# Patient Record
Sex: Female | Born: 2000 | Race: White | Hispanic: No | Marital: Single | Smoking: Current every day smoker
Health system: Southern US, Community
[De-identification: ages and names within clinical notes are randomized; demographics above are authoritative.]

## PROBLEM LIST (undated history)

## (undated) DIAGNOSIS — R011 Cardiac murmur, unspecified: Secondary | ICD-10-CM

## (undated) DIAGNOSIS — F329 Major depressive disorder, single episode, unspecified: Secondary | ICD-10-CM

## (undated) DIAGNOSIS — E46 Unspecified protein-calorie malnutrition: Secondary | ICD-10-CM

## (undated) DIAGNOSIS — F419 Anxiety disorder, unspecified: Secondary | ICD-10-CM

## (undated) DIAGNOSIS — T7840XA Allergy, unspecified, initial encounter: Secondary | ICD-10-CM

## (undated) DIAGNOSIS — F32A Depression, unspecified: Secondary | ICD-10-CM

## (undated) DIAGNOSIS — B1920 Unspecified viral hepatitis C without hepatic coma: Secondary | ICD-10-CM

## (undated) DIAGNOSIS — R569 Unspecified convulsions: Secondary | ICD-10-CM

## (undated) HISTORY — DX: Allergy, unspecified, initial encounter: T78.40XA

## (undated) HISTORY — DX: Unspecified viral hepatitis C without hepatic coma: B19.20

---
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
## 2019-01-29 ENCOUNTER — Ambulatory Visit: Payer: Commercial Managed Care - PPO | Admitting: Family Medicine

## 2019-01-29 ENCOUNTER — Encounter: Payer: Self-pay | Admitting: Family Medicine

## 2019-01-29 VITALS — BP 98/68 | HR 98 | Temp 98.5°F | Wt 115.0 lb

## 2019-01-29 DIAGNOSIS — F329 Major depressive disorder, single episode, unspecified: Secondary | ICD-10-CM

## 2019-01-29 DIAGNOSIS — F1910 Other psychoactive substance abuse, uncomplicated: Secondary | ICD-10-CM

## 2019-01-29 DIAGNOSIS — F419 Anxiety disorder, unspecified: Secondary | ICD-10-CM | POA: Diagnosis not present

## 2019-01-29 NOTE — Patient Instructions (Signed)
Living With Anxiety  After being diagnosed with an anxiety disorder, you may be relieved to know why you have felt or behaved a certain way. It is natural to also feel overwhelmed about the treatment ahead and what it will mean for your life. With care and support, you can manage this condition and recover from it. How to cope with anxiety Dealing with stress Stress is your body's reaction to life changes and events, both good and bad. Stress can last just a few hours or it can be ongoing. Stress can play a major role in anxiety, so it is important to learn both how to cope with stress and how to think about it differently. Talk with your health care provider or a counselor to learn more about stress reduction. He or she may suggest some stress reduction techniques, such as:  Music therapy. This can include creating or listening to music that you enjoy and that inspires you.  Mindfulness-based meditation. This involves being aware of your normal breaths, rather than trying to control your breathing. It can be done while sitting or walking.  Centering prayer. This is a kind of meditation that involves focusing on a word, phrase, or sacred image that is meaningful to you and that brings you peace.  Deep breathing. To do this, expand your stomach and inhale slowly through your nose. Hold your breath for 3-5 seconds. Then exhale slowly, allowing your stomach muscles to relax.  Self-talk. This is a skill where you identify thought patterns that lead to anxiety reactions and correct those thoughts.  Muscle relaxation. This involves tensing muscles then relaxing them. Choose a stress reduction technique that fits your lifestyle and personality. Stress reduction techniques take time and practice. Set aside 5-15 minutes a day to do them. Therapists can offer training in these techniques. The training may be covered by some insurance plans. Other things you can do to manage stress include:  Keeping a  stress diary. This can help you learn what triggers your stress and ways to control your response.  Thinking about how you respond to certain situations. You may not be able to control everything, but you can control your reaction.  Making time for activities that help you relax, and not feeling guilty about spending your time in this way. Therapy combined with coping and stress-reduction skills provides the best chance for successful treatment. Medicines Medicines can help ease symptoms. Medicines for anxiety include:  Anti-anxiety drugs.  Antidepressants.  Beta-blockers. Medicines may be used as the main treatment for anxiety disorder, along with therapy, or if other treatments are not working. Medicines should be prescribed by a health care provider. Relationships Relationships can play a big part in helping you recover. Try to spend more time connecting with trusted friends and family members. Consider going to couples counseling, taking family education classes, or going to family therapy. Therapy can help you and others better understand the condition. How to recognize changes in your condition Everyone has a different response to treatment for anxiety. Recovery from anxiety happens when symptoms decrease and stop interfering with your daily activities at home or work. This may mean that you will start to:  Have better concentration and focus.  Sleep better.  Be less irritable.  Have more energy.  Have improved memory. It is important to recognize when your condition is getting worse. Contact your health care provider if your symptoms interfere with home or work and you do not feel like your condition is improving. Where   to find help and support: You can get help and support from these sources:  Self-help groups.  Online and [employer].  A trusted spiritual leader.  Couples counseling.  Family education classes.  Family therapy. Follow these instructions  at home:  Eat a healthy diet that includes plenty of vegetables, fruits, whole grains, low-fat dairy products, and lean protein. Do not eat a lot of foods that are high in solid fats, added sugars, or salt.  Exercise. Most adults should do the following: ? Exercise for at least 150 minutes each week. The exercise should increase your heart rate and make you sweat (moderate-intensity exercise). ? Strengthening exercises at least twice a week.  Cut down on caffeine, tobacco, alcohol, and other potentially harmful substances.  Get the right amount and quality of sleep. Most adults need 7-9 hours of sleep each night.  Make choices that simplify your life.  Take over-the-counter and prescription medicines only as told by your health care provider.  Avoid caffeine, alcohol, and certain over-the-counter cold medicines. These may make you feel worse. Ask your pharmacist which medicines to avoid.  Keep all follow-up visits as told by your health care provider. This is important. Questions to ask your health care provider  Would I benefit from therapy?  How often should I follow up with a health care provider?  How long do I need to take medicine?  Are there any long-term side effects of my medicine?  Are there any alternatives to taking medicine? Contact a health care provider if:  You have a hard time staying focused or finishing daily tasks.  You spend many hours a day feeling worried about everyday life.  You become exhausted by worry.  You start to have headaches, feel tense, or have nausea.  You urinate more than normal.  You have diarrhea. Get help right away if:  You have a racing heart and shortness of breath.  You have thoughts of hurting yourself or others. If you ever feel like you may hurt yourself or others, or have thoughts about taking your own life, get help right away. You can go to your nearest emergency department or call:  Your local emergency services  (911 in the [place]  A suicide crisis helpline, such as the National Suicide Prevention Lifeline at 854-763-5670. This is open 24-hours a day. Summary  Taking steps to deal with stress can help calm you.  Medicines cannot cure anxiety disorders, but they can help ease symptoms.  Family, friends, and partners can play a big part in helping you recover from an anxiety disorder. This information is not intended to replace advice given to you by your health care provider. Make sure you discuss any questions you have with your health care provider. Document Released: 11/02/2016 Document Revised: 11/02/2016 Document Reviewed: 11/02/2016 Elsevier Interactive Patient Education  2019 [employer].  Substance Use Disorder Substance use disorder occurs when a person's repeated use of drugs or alcohol interferes with his or her ability to be productive. This disorder can cause problems with mental and physical health. It can affect your ability to have healthy relationships, and it can keep you from being able to meet your responsibilities at work, home, or school. It can also lead to addiction, which is a condition in which the person cannot stop using the substance consistently for a period of time. The most commonly abused substances include:  Alcohol.  Tobacco.  Marijuana.  Stimulants, such as cocaine and methamphetamine.  Hallucinogens, such as LSD  and PCP.  Opioids, such as some prescription pain medicines and heroin. What are the causes? This condition may develop due to many complex social, psychological, or physical reasons, such as:  Stress.  Abuse.  Peer pressure.  Anxiety or depression. What increases the risk? This condition is more likely to develop in people who:  Use substances to cope with stress.  Have been abused.  Have a mental health disorder, such as depression.  Have a family history of substance use disorder. What are the signs or symptoms? Symptoms of  this condition include:  Using the substance for longer periods of time or at a higher dosage than what is normal or intended.  Having a lasting desire to use the substance.  Being unable to slow down or stop your use of the substance.  Spending an abnormal amount of time getting the substance, using the substance, or recovering from using the substance.  Craving the substance.  Using the substance in a way that interferes with work, school, social activities, and personal relationships.  Using the substance even after having negative consequences, such as: ? Health problems. ? Legal or financial troubles. ? Job loss. ? Broken relationships.  Needing more and more of the substance to get the same effect (developing tolerance).  Experiencing unpleasant symptoms if you do not use the substance (withdrawal).  Using the substance to avoid withdrawal. How is this diagnosed? This condition may be diagnosed based on:  A physical exam.  Your history of substance use.  Your symptoms. This includes: ? How substance use affects your life. ? Changes in personality, behaviors, and mood. ? Having at least two symptoms of substance use disorder within a 69-month period. ? Health issues related to substance use, such as liver damage, shortness of breath, fatigue, cough, or heart problems.  Blood or urine tests to screen for alcohol and drugs. How is this treated? This condition may be treated by:  Stopping substance use safely. This may require taking medicines and being closely observed for several days.  Taking part in group and individual counseling from mental health providers who help people with substance use disorder.  Staying at a live-in (residential) treatment center for several days or weeks.  Attending daily counseling sessions at a treatment center.  Taking medicine as told by your health care provider: ? To ease symptoms and prevent complications during  withdrawal. ? To treat other mental health issues, such as depression or anxiety. ? To block cravings by causing the same effects as the substance. ? To block the effects of the substance or replace good sensations with unpleasant ones.  Going to a support group to share your experience with others who are going through the same thing. Recovery can be a long process. Many people who undergo treatment start using the substance again after stopping (relapse). If you relapse, that does not mean that treatment will not work. Follow these instructions at home:   Take over-the-counter and prescription medicines only as told by your health care provider.  Do not use any drugs or alcohol.  Attend support groups as needed. These groups, including 12-step programs like Alcoholics Anonymous and Narcotics Anonymous, are an important part of long-term recovery for many people.  Keep all follow-up visits as told by your health care providers. This is important. This includes continuing to work with therapists and support groups. Contact a health care provider if:  You cannot take your medicines as told.  Your symptoms get worse.  You  have trouble resisting the urge to use drugs or alcohol. Get help right away if you:  Relapse.  Think that you may have taken too much of a drug. The hotline of the [facility] is 5146256154.  Have signs of an overdose. Symptoms include: ? Chest pain. ? Confusion. ? Sleepiness or difficulty staying awake. ? Slowed breathing. ? Nausea or vomiting. ? A seizure.  Have serious thoughts about hurting yourself or someone else. Drug overdose is an emergency. Do not wait to see if the symptoms will go away. Get medical help right away. Call your local emergency services (911 in the [place] Do not drive yourself to the hospital. If you ever feel like you may hurt yourself or others, or have thoughts about taking your own life, get help right away.  You can go to your nearest emergency department or call:  Your local emergency services (911 in the [place]  A suicide crisis helpline, such as the National Suicide Prevention Lifeline at 813-851-7620. This is open 24 hours a day. Summary  Substance use disorder occurs when a person's repeated use of drugs or alcohol interferes with his or her ability to be productive. It can affect your ability to have healthy relationships, keep you from being able to meet your responsibilities at work, home, or school, and lead to addiction.  Taking part in group and individual counseling from mental health providers is one possible treatment for people with substance use disorder.  Recovery can be a long process. Many people who undergo treatment start using the substance again after stopping (relapse). A relapse does not mean that treatment will not work.  Attend support groups as needed, such as Alcoholics Anonymous and Narcotics Anonymous. These groups are an important part of long-term recovery for many people. This information is not intended to replace advice given to you by your health care provider. Make sure you discuss any questions you have with your health care provider. Document Released: 06/30/2005 Document Revised: 12/20/2017 Document Reviewed: 12/20/2017 Elsevier Interactive Patient Education  2019 [employer]  Living With Depression Everyone experiences occasional disappointment, sadness, and loss in their lives. When you are feeling down, blue, or sad for at least 2 weeks in a row, it may mean that you have depression. Depression can affect your thoughts and feelings, relationships, daily activities, and physical health. It is caused by changes in the way your brain functions. If you receive a diagnosis of depression, your health care provider will tell you which type of depression you have and what treatment options are available to you. If you are living with depression, there are ways  to help you recover from it and also ways to prevent it from coming back. How to cope with lifestyle changes Coping with stress     Stress is your body's reaction to life changes and events, both good and bad. Stressful situations may include:  Getting married.  The death of a spouse.  Losing a job.  Retiring.  Having a baby. Stress can last just a few hours or it can be ongoing. Stress can play a major role in depression, so it is important to learn both how to cope with stress and how to think about it differently. Talk with your health care provider or a counselor if you would like to learn more about stress reduction. He or she may suggest some stress reduction techniques, such as:  Music therapy. This can include creating music or listening to music.  Choose music that you enjoy and that inspires you.  Mindfulness-based meditation. This kind of meditation can be done while sitting or walking. It involves being aware of your normal breaths, rather than trying to control your breathing.  Centering prayer. This is a kind of meditation that involves focusing on a spiritual word or phrase. Choose a word, phrase, or sacred image that is meaningful to you and that brings you peace.  Deep breathing. To do this, expand your stomach and inhale slowly through your nose. Hold your breath for 3-5 seconds, then exhale slowly, allowing your stomach muscles to relax.  Muscle relaxation. This involves intentionally tensing muscles then relaxing them. Choose a stress reduction technique that fits your lifestyle and personality. Stress reduction techniques take time and practice to develop. Set aside 5-15 minutes a day to do them. Therapists can offer training in these techniques. The training may be covered by some insurance plans. Other things you can do to manage stress include:  Keeping a stress diary. This can help you learn what triggers your stress and ways to control your  response.  Understanding what your limits are and saying no to requests or events that lead to a schedule that is too full.  Thinking about how you respond to certain situations. You may not be able to control everything, but you can control how you react.  Adding humor to your life by watching funny films or TV shows.  Making time for activities that help you relax and not feeling guilty about spending your time this way.  Medicines Your health care provider may suggest certain medicines if he or she feels that they will help improve your condition. Avoid using alcohol and other substances that may prevent your medicines from working properly (may interact). It is also important to:  Talk with your pharmacist or health care provider about all the medicines that you take, their possible side effects, and what medicines are safe to take together.  Make it your goal to take part in all treatment decisions (shared decision-making). This includes giving input on the side effects of medicines. It is best if shared decision-making with your health care provider is part of your total treatment plan. If your health care provider prescribes a medicine, you may not notice the full benefits of it for 4-8 weeks. Most people who are treated for depression need to be on medicine for at least 6-12 months after they feel better. If you are taking medicines as part of your treatment, do not stop taking medicines without first talking to your health care provider. You may need to have the medicine slowly decreased (tapered) over time to decrease the risk of harmful side effects. Relationships Your health care provider may suggest family therapy along with individual therapy and drug therapy. While there may not be family problems that are causing you to feel depressed, it is still important to make sure your family learns as much as they can about your mental health. Having your family's support can help make your  treatment successful. How to recognize changes in your condition Everyone has a different response to treatment for depression. Recovery from major depression happens when you have not had signs of major depression for two months. This may mean that you will start to:  Have more interest in doing activities.  Feel less hopeless than you did 2 months ago.  Have more energy.  Overeat less often, or have better or improving appetite.  Have better concentration.  Your health care provider will work with you to decide the next steps in your recovery. It is also important to recognize when your condition is getting worse. Watch for these signs:  Having fatigue or low energy.  Eating too much or too little.  Sleeping too much or too little.  Feeling restless, agitated, or hopeless.  Having trouble concentrating or making decisions.  Having unexplained physical complaints.  Feeling irritable, angry, or aggressive. Get help as soon as you or your family members notice these symptoms coming back. How to get support and help from others How to talk with friends and family members about your condition  Talking to friends and family members about your condition can provide you with one way to get support and guidance. Reach out to trusted friends or family members, explain your symptoms to them, and let them know that you are working with a health care provider to treat your depression. Financial resources Not all insurance plans cover mental health care, so it is important to check with your insurance carrier. If paying for co-pays or counseling services is a problem, search for a local or county mental health care center. They may be able to offer public mental health care services at low or no cost when you are not able to see a private health care provider. If you are taking medicine for depression, you may be able to get the generic form, which may be less expensive. Some makers of  prescription medicines also offer help to patients who cannot afford the medicines they need. Follow these instructions at home:   Get the right amount and quality of sleep.  Cut down on using caffeine, tobacco, alcohol, and other potentially harmful substances.  Try to exercise, such as walking or lifting small weights.  Take over-the-counter and prescription medicines only as told by your health care provider.  Eat a healthy diet that includes plenty of vegetables, fruits, whole grains, low-fat dairy products, and lean protein. Do not eat a lot of foods that are high in solid fats, added sugars, or salt.  Keep all follow-up visits as told by your health care provider. This is important. Contact a health care provider if:  You stop taking your antidepressant medicines, and you have any of these symptoms: ? Nausea. ? Headache. ? Feeling lightheaded. ? Chills and body aches. ? Not being able to sleep (insomnia).  You or your friends and family think your depression is getting worse. Get help right away if:  You have thoughts of hurting yourself or others. If you ever feel like you may hurt yourself or others, or have thoughts about taking your own life, get help right away. You can go to your nearest emergency department or call:  Your local emergency services (911 in the [place]  A suicide crisis helpline, such as the National Suicide Prevention Lifeline at 760-577-6281. This is open 24-hours a day. Summary  If you are living with depression, there are ways to help you recover from it and also ways to prevent it from coming back.  Work with your health care team to create a management plan that includes counseling, stress management techniques, and healthy lifestyle habits. This information is not intended to replace advice given to you by your health care provider. Make sure you discuss any questions you have with your health care provider. Document Released: 10/11/2016  Document Revised: 10/11/2016 Document Reviewed: 10/11/2016 Elsevier Interactive Patient Education  2019 [employer].

## 2019-01-29 NOTE — Progress Notes (Signed)
Subjective:    Patient ID: Lori Hull, female    DOB: 22-Dec-2000, 18 y.o.   MRN: [id number]  No chief complaint on file.   HPI Patient was seen today for.  Pt endorses increasing anxiety depression as well as drug relapse.  Pt states she has been using heroin x 5 months.  Pt completed an outpt treatment program 5 days per wk off of [facility].  Pt requesting meds to help reduce withdrawal such as suboxone.  Pt states she was likely exposed to Hep C by sharring needles.   Pt states her mom is aware of her relapse, but not aware of Hep C exposure.  Pt has not had recent f/u with her psychiatrist Drs. Hoover and Aganwal.  Pt has been off of Lexapro 20 mg, risperidone 4 mg and divalproex 75 mg.  Pt is interested in treatment programs.  Denies SI/HI.  Past Medical History:  Diagnosis Date  . Allergy   . Anxiety   . Depression   . Heart murmur   . Protein deficiency (HCC)   . Seizures (HCC)     No Known Allergies  ROS General: Denies fever, chills, night sweats, changes in weight, changes in appetite HEENT: Denies headaches, ear pain, changes in vision, rhinorrhea, sore throat CV: Denies CP, palpitations, SOB, orthopnea Pulm: Denies SOB, cough, wheezing GI: Denies abdominal pain, nausea, vomiting, diarrhea, constipation GU: Denies dysuria, hematuria, frequency, vaginal discharge Msk: Denies muscle cramps, joint pains Neuro: Denies weakness, numbness, tingling Skin: Denies rashes, bruising Psych: Denies hallucinations  +substance abuse, anxiety, depression    Objective:    Blood pressure 98/68, pulse 98, temperature 98.5 F (36.9 C), temperature source Oral, weight 115 lb (52.2 kg), SpO2 97 %.  Gen. Pleasant, well-nourished, in no distress, normal affect   HEENT: [place]/AT, face symmetric, no scleral icterus, PERRLA, nares patent without drainage Lungs: no accessory muscle use, CTAB, no wheezes or rales Cardiovascular: RRR, no m/r/g, no peripheral edema Neuro:  A&Ox3, CN II-XII  intact, normal gait Skin:  Warm, no lesions/ rash   Wt Readings from Last 3 Encounters:  01/29/19 115 lb (52.2 kg) (28 %, Z= -0.59)*  11/13/18 129 lb 3.2 oz (58.6 kg) (58 %, Z= 0.20)*  10/18/18 140 lb (63.5 kg) (74 %, Z= 0.64)*   * Growth percentiles are based on CDC (Girls, 2-20 Years) data.    Lab Results  Component Value Date   WBC 8.3 01/02/2018   HGB 15.4 01/02/2018   HCT 44.5 01/02/2018   PLT 264 01/02/2018   GLUCOSE 87 01/02/2018   ALT 19 01/02/2018   AST 20 01/02/2018   NA 140 01/02/2018   K 4.0 01/02/2018   CL 102 01/02/2018   CREATININE 0.72 01/02/2018   BUN 10 01/02/2018   CO2 25 01/02/2018   INR 0.99 05/14/2017    Assessment/Plan:  Substance abuse (HCC) -heroin relapse -Pt advised this provider able to prescribe Suboxone/similar medications -Pt interested in treatment.  Given a list of area treatment programs.  Advised to contact ASAP -Advised to proceed to ED for worsening symptoms. -Given handout -Advised to follow-up in the next few days to week if needed.  Anxiety and depression -PHQ 9 score 25 -Gad 7 score 16 -Patient strongly encouraged to reach out to her previous psychiatrist Drs. Hoover and Aganwal to restart medications/follow-up. -Close follow-up recommended  F/u prn in in the next 1-2 wks, sooner if needed.  Abbe Amsterdam, MD

## 2019-02-05 ENCOUNTER — Other Ambulatory Visit: Payer: Self-pay

## 2019-02-05 ENCOUNTER — Encounter (HOSPITAL_COMMUNITY): Payer: Self-pay | Admitting: Psychiatry

## 2019-02-05 ENCOUNTER — Ambulatory Visit (INDEPENDENT_AMBULATORY_CARE_PROVIDER_SITE_OTHER): Payer: Commercial Managed Care - PPO | Admitting: Psychiatry

## 2019-02-05 VITALS — BP 115/75 | HR 109 | Temp 97.8°F | Ht 68.0 in | Wt 112.8 lb

## 2019-02-05 DIAGNOSIS — F411 Generalized anxiety disorder: Secondary | ICD-10-CM | POA: Insufficient documentation

## 2019-02-05 DIAGNOSIS — F4010 Social phobia, unspecified: Secondary | ICD-10-CM | POA: Diagnosis not present

## 2019-02-05 DIAGNOSIS — F1221 Cannabis dependence, in remission: Secondary | ICD-10-CM | POA: Diagnosis not present

## 2019-02-05 DIAGNOSIS — F410 Panic disorder [episodic paroxysmal anxiety] without agoraphobia: Secondary | ICD-10-CM | POA: Diagnosis not present

## 2019-02-05 DIAGNOSIS — F1121 Opioid dependence, in remission: Secondary | ICD-10-CM | POA: Insufficient documentation

## 2019-02-05 DIAGNOSIS — F5082 Avoidant/restrictive food intake disorder: Secondary | ICD-10-CM

## 2019-02-05 DIAGNOSIS — F1320 Sedative, hypnotic or anxiolytic dependence, uncomplicated: Secondary | ICD-10-CM

## 2019-02-05 MED ORDER — FLUOXETINE HCL 20 MG PO TABS: 20.0000 mg | ORAL_TABLET | Freq: Every day | ORAL | 0 refills | Status: DC

## 2019-02-05 MED ORDER — QUETIAPINE FUMARATE 25 MG PO TABS: 25.0000 mg | ORAL_TABLET | Freq: Every day | ORAL | 0 refills | Status: DC

## 2019-02-05 NOTE — Progress Notes (Signed)
BH MD/PA/NP OP Progress Note  02/05/2019 4:19 PM Lori Hull  MRN:  [id number]  Chief Complaint: Depression, anxiety HPI: 18 yo single female who has been seen previously by Dr. Danelle Berry. Her last visit with Dr. Milana Kidney was on 05/28/18. Patient has since gone to IP rehab at "[facility]" and was taken off all her psychotropic medications at that time. She ws last on a combination of Depakote ER 750 mg, Lexapro 20 mg, risperidone 4 mg - these were started while she was at [facility] with what it appears to be drug induced psychosis. Patient has hx of polysubstance dependence: she abused Xanax since age 81 (sober for close to a year), alcohol since age 24 (sober for 9 months), opioids/heroin since 2018, sober for 1.5 months; cannabis since age 38 - clean for 1.5 year. She has a hx of alprazolam withdrawal seizures. She also has a hx of depression, anxiety (both generalized and panic type), SI (OD twice, cut self once in the past and was admitted to [facility] as a result of that). Her past medication trials (in addition to one described above) included fluoxetine, sertraline, mirtazapine, trazodone and hydroxyzine. All these medciations were taken intermittently and while patient was still abusing multiple substances so it is difficult to judge how much benefit she got from them. Patient had been given diagnoses of major depressive disorder, GAD, panic disorder, social anxiety disorder in addition to polysubstance abuse.  Lori Hull now additionally admits to ep[isodes suggestive of possible bipolar 2 disorder - episodes lasting up to 3 days when she needed lass sleep, felt hyper (increased energy), racing thoughts, irritability. She did not endorse feeling grandiose or psychotic at those times. She "thinks" they can occur once a month or less. She also admits to being preoccupied with being "skinnY' and would at times restrict how much she eats so as not to gain weight. In the past she was  prescribed mirtazapine to regain appetite and gain some weight. She denies binging or purging, denies abusing laxatives.  Visit Diagnosis:    ICD-10-CM   1. GAD (generalized anxiety disorder) F41.1   2. Social anxiety disorder F40.10   3. Panic disorder F41.0   4. 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 Opioid use disorder, moderate, in early remission, dependence (HCC) F11.21   7. 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 Allergy   . Anxiety   . Depression   . Heart murmur   . Protein deficiency (HCC)   . Seizures (HCC)    No past surgical history on file.  Family Psychiatric History: reviewed  Family History:  Family History  Problem Relation Age of Onset  . Anxiety disorder Mother   . Cancer Mother   . Anxiety disorder Brother   . Drug abuse Brother   . Alcohol abuse Paternal Grandfather   . Anxiety disorder Cousin   . Heart attack Father     Social History:  Social History   Socioeconomic History  . Marital status: Single    Spouse name: Not on file  . Number of children: Not on file  . Years of education: Not on file  . Highest education level: Not on file  Occupational History  . Not on file  Social Needs  . Financial resource strain: Not  on file  . Food insecurity:    Worry: Not on file    Inability: Not on file  . Transportation needs:    Medical: Not on file    Non-medical: Not on file  Tobacco Use  . Smoking status: Current Some Day Smoker    Packs/day: 0.75    Years: 0.60    Pack years: 0.45    Types: Cigarettes  . Smokeless tobacco: Never Used  Substance and Sexual Activity  . Alcohol use: Not Currently  . Drug use: Yes    Types: Marijuana, Heroin, Other-see comments    Comment: Heroin: Last PTA, daily basis   . Sexual  activity: Yes  Lifestyle  . Physical activity:    Days per week: Not on file    Minutes per session: Not on file  . Stress: Very much  Relationships  . Social connections:    Talks on phone: Not on file    Gets together: Not on file    Attends religious service: Not on file    Active member of club or organization: Not on file    Attends meetings of clubs or organizations: Not on file    Relationship status: Not on file  Other Topics Concern  . Not on file  Social History Narrative  . Not on file  Patient lives with mother but plans to move out on her own (with friends) in Summer. She is a Printmaker at [employer]   Allergies: No Known Allergies  Metabolic Disorder Labs: No results found for: HGBA1C, MPG No results found for: PROLACTIN No results found for: CHOL, TRIG, HDL, CHOLHDL, VLDL, LDLCALC No results found for: TSH  Therapeutic Level Labs: No results found for: LITHIUM No results found for: VALPROATE No components found for:  CBMZ  Current Medications: Current Outpatient Medications  Medication Sig Dispense Refill  . FLUoxetine (PROZAC) 20 MG tablet Take 1 tablet (20 mg total) by mouth daily for 30 days. 30 tablet 0  . naloxone (NARCAN) 2 MG/2ML injection Inject into muscle for accidental opiate overdose 2 mL 1  . QUEtiapine (SEROQUEL) 25 MG tablet Take 1 tablet (25 mg total) by mouth at bedtime for 30 days. 30 tablet 0   No current facility-administered medications for this visit.      Musculoskeletal: Strength & Muscle Tone: within normal limits Gait & Station: normal Patient leans: N/A  Psychiatric Specialty Exam: Review of Systems  Constitutional: Negative.   HENT: Negative.   Eyes: Negative.   Respiratory: Negative.   Cardiovascular: Negative.   Gastrointestinal: Negative.   Genitourinary: Negative.   Musculoskeletal: Negative.   Skin: Negative.   Neurological: Negative.   Endo/Heme/Allergies: Negative.   Psychiatric/Behavioral: Positive for  depression. The patient is nervous/anxious and has insomnia.     Blood pressure 115/75, pulse (!) 109, temperature 97.8 F (36.6 C), height 5\' 8"  (1.727 m), weight 112 lb 12.8 oz (51.2 kg).Body mass index is 17.15 kg/m.  General Appearance: Casual  Eye Contact:  Fair  Speech:  Clear and Coherent  Volume:  Normal  Mood:  Anxious and Depressed  Affect:  Full Range  Thought Process:  Goal Directed  Orientation:  Full (Time, Place, and Person)  Thought Content: Logical   Suicidal Thoughts:  Yes.  without intent/plan, intermittent  Homicidal Thoughts:  No  Memory:  Immediate;   Good Recent;   Good Remote;   Good  Judgement:  Poor  Insight:  Shallow  Psychomotor Activity:  Normal  Concentration:  Concentration: Fair  Recall:  Jennelle Human of Knowledge: Fair  Language: Good  Akathisia:  Negative  Handed:  Right  AIMS (if indicated): not done  Assets:  Desire for Improvement Housing Physical Health Social Support  ADL's:  Intact  Cognition: WNL  Sleep:  Fair   Screenings: GAD-7     Office Visit from 01/29/2019 in [facility] from 12/13/2017 in [place] [facility]  Total GAD-7 Score  16  13    PHQ2-9     Office Visit from 01/29/2019 in [facility] from 12/13/2017 in [place] [facility]  PHQ-2 Total Score  6  6  PHQ-9 Total Score  25  14       Assessment and Plan: 18 yo single female with long hx of polysubstance abus who is now in sustained remission from alcohol/cannabis, alprazolam and early remission from opioids (heroin). She has a hx of anxiety (generaized, panic, social type), depression with two OD attempts and possibly bipolar 2 disorder (depression). Several med trials not very meaningfull as she was abusing drugs at the same time. She is now on no psychotropic meds and came back to clinic  (prevuously patient of Dr. Leanora Cover) and is interested in treatment of depression/anxiety. Some insomnia. Occasional SI. No psychosis.  Dx impression: GAD; Panic disorder; Social anxiety; Polysubstance abuse in remission (see above); susp. Bipolar 2 disorder, depressed moderate; Restrictive food intake disorder  Plan: Retry fluoxetine 20 mg daily. Sertraline start at 25 mg at HS for mood. The plan was discussed with patient. I spend 45 minutes in direct face to face clinical contact with the patient and devoted approximately 50% of this time to explanation of diagnosis, discussion of treatment options and med education. Return to clinic in one month.   Magdalene Patricia, MD 02/05/2019, 4:19 PM

## 2019-03-06 ENCOUNTER — Other Ambulatory Visit (HOSPITAL_COMMUNITY): Payer: Self-pay

## 2019-03-06 MED ORDER — QUETIAPINE FUMARATE 25 MG PO TABS: 25.0000 mg | ORAL_TABLET | Freq: Every day | ORAL | 0 refills | Status: DC

## 2019-03-06 MED ORDER — FLUOXETINE HCL 20 MG PO TABS: 20.0000 mg | ORAL_TABLET | Freq: Every day | ORAL | 0 refills | Status: DC

## 2019-03-06 NOTE — Progress Notes (Unsigned)
Fax from pharmacy received for a refill on patients medications, she will be out before her appointment this week, 30 day order sent to the pharmacy per protocol

## 2019-03-08 ENCOUNTER — Ambulatory Visit (INDEPENDENT_AMBULATORY_CARE_PROVIDER_SITE_OTHER): Payer: Commercial Managed Care - PPO | Admitting: Psychiatry

## 2019-03-08 ENCOUNTER — Other Ambulatory Visit: Payer: Self-pay

## 2019-03-08 DIAGNOSIS — F4010 Social phobia, unspecified: Secondary | ICD-10-CM

## 2019-03-08 DIAGNOSIS — F411 Generalized anxiety disorder: Secondary | ICD-10-CM | POA: Diagnosis not present

## 2019-03-08 DIAGNOSIS — F5082 Avoidant/restrictive food intake disorder: Secondary | ICD-10-CM

## 2019-03-08 DIAGNOSIS — F1221 Cannabis dependence, in remission: Secondary | ICD-10-CM

## 2019-03-08 DIAGNOSIS — F1121 Opioid dependence, in remission: Secondary | ICD-10-CM

## 2019-03-08 DIAGNOSIS — F410 Panic disorder [episodic paroxysmal anxiety] without agoraphobia: Secondary | ICD-10-CM | POA: Diagnosis not present

## 2019-03-08 DIAGNOSIS — F319 Bipolar disorder, unspecified: Secondary | ICD-10-CM | POA: Insufficient documentation

## 2019-03-08 DIAGNOSIS — F3181 Bipolar II disorder: Secondary | ICD-10-CM

## 2019-03-08 MED ORDER — QUETIAPINE FUMARATE 25 MG PO TABS: 25.0000 mg | ORAL_TABLET | Freq: Every day | ORAL | 0 refills | Status: DC

## 2019-03-08 MED ORDER — FLUOXETINE HCL 20 MG PO TABS: 40.0000 mg | ORAL_TABLET | Freq: Every day | ORAL | 1 refills | Status: DC

## 2019-03-08 NOTE — Progress Notes (Signed)
BH MD/PA/NP OP Progress Note  03/08/2019 11:13 AM Lori Hull  MRN:  [id number] Interview was conducted using teleconferencing and I verified that I was speaking with the correct person using two identifiers. I discussed the limitations of evaluation and management by telemedicine and  the availability of in person appointments. Patient expressed understanding and agreed to proceed.  Chief Complaint: Some anxiety.  HPI: 18 yo single female with long hx of polysubstance abuse who is now in sustained remission from alcohol/cannabis, alprazolam and early remission from opioids (heroin). She has a hx of anxiety (generaized, panic, social type), depression with two OD attempts very likely bipolar 2 disorder (depression). Several med trials not very meaningfull as she was abusing drugs at the same time. She is now on no psychotropic meds and came back to clinic (prevuously patient of Dr. Leanora Cover) and is interested in treatment of depression/anxiety. Retried on fluoxetine 20 mg - tolerates it well and reports decrease of anxiety and depression. Sleep normalized after addition of low dose 25 mg of Seroquel. She denies abusing drugs. No SI, no psychosis.   Visit Diagnosis:    ICD-10-CM   1. GAD (generalized anxiety disorder) F41.1   2. Social anxiety disorder F40.10   3. Panic disorder F41.0   4. 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 Opioid use disorder, moderate, in early remission, dependence (HCC) F11.21   6. 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 Bipolar 2 disorder (HCC) F31.81     Past Psychiatric History: Please refer to intake H&P.  Past Medical History:  Past Medical History:  Diagnosis Date  . Allergy   . Anxiety   . Depression   . Heart murmur   . Protein deficiency (HCC)   . Seizures (HCC)    No past surgical history on file.  Family Psychiatric History: Reviewed.  Family History:  Family History  Problem  Relation Age of Onset  . Anxiety disorder Mother   . Cancer Mother   . Anxiety disorder Brother   . Drug abuse Brother   . Alcohol abuse Paternal Grandfather   . Anxiety disorder Cousin   . Heart attack Father     Social History:  Social History   Socioeconomic History  . Marital status: Single    Spouse name: Not on file  . Number of children: Not on file  . Years of education: Not on file  . Highest education level: Not on file  Occupational History  . Not on file  Social Needs  . Financial resource strain: Not on file  . Food insecurity:    Worry: Not on file    Inability: Not on file  . Transportation needs:    Medical: Not on file    Non-medical: Not on file  Tobacco Use  . Smoking status: Current Some Day Smoker    Packs/day: 0.75    Years: 0.60    Pack years: 0.45    Types: Cigarettes  . Smokeless tobacco: Never Used  Substance and Sexual Activity  . Alcohol use: Not Currently  . Drug use: Yes    Types: Marijuana, Heroin, Other-see comments    Comment: Heroin: Last PTA, daily basis   . Sexual activity: Yes  Lifestyle  . Physical activity:    Days per week: Not on file    Minutes per session: Not on file  . Stress: Very much  Relationships  . Social connections:    Talks on phone:  Not on file    Gets together: Not on file    Attends religious service: Not on file    Active member of club or organization: Not on file    Attends meetings of clubs or organizations: Not on file    Relationship status: Not on file  Other Topics Concern  . Not on file  Social History Narrative  . Not on file    Allergies: No Known Allergies  Metabolic Disorder Labs: No results found for: HGBA1C, MPG No results found for: PROLACTIN No results found for: CHOL, TRIG, HDL, CHOLHDL, VLDL, LDLCALC No results found for: TSH  Therapeutic Level Labs: No results found for: LITHIUM No results found for: VALPROATE No components found for:  CBMZ  Current  Medications: Current Outpatient Medications  Medication Sig Dispense Refill  . FLUoxetine (PROZAC) 20 MG tablet Take 1 tablet (20 mg total) by mouth daily for 30 days. 30 tablet 0  . naloxone (NARCAN) 2 MG/2ML injection Inject into muscle for accidental opiate overdose 2 mL 1  . QUEtiapine (SEROQUEL) 25 MG tablet Take 1 tablet (25 mg total) by mouth at bedtime for 30 days. 30 tablet 0   No current facility-administered medications for this visit.      Musculoskeletal:  Psychiatric Specialty Exam: Review of Systems  Psychiatric/Behavioral: The patient is nervous/anxious.   All other systems reviewed and are negative.   There were no vitals taken for this visit.There is no height or weight on file to calculate BMI.  General Appearance: NA  Eye Contact:  NA  Speech:  Clear and Coherent  Volume:  Normal  Mood:  Anxious  Affect:  NA  Thought Process:  Goal Directed  Orientation:  Full (Time, Place, and Person)  Thought Content: Logical   Suicidal Thoughts:  No  Homicidal Thoughts:  No  Memory:  Immediate;   Good Recent;   Good Remote;   Good  Judgement:  Fair  Insight:  Fair  Psychomotor Activity:  NA  Concentration:  Concentration: Good  Recall:  Good  Fund of Knowledge: Good  Language: Good  Akathisia:  NA  Handed:  Right  AIMS (if indicated): not done  Assets:  Communication Skills Desire for Improvement Housing Physical Health Resilience  ADL's:  Intact  Cognition: WNL  Sleep:  Good   Screenings: GAD-7     [facility] from 12/13/2017 [facility] [place] [facility]  Total GAD-7 Score  16  13    PHQ2-9     [facility] from 12/13/2017 in [place] [facility]  PHQ-2 Total Score  6  6  PHQ-9 Total Score  25  14       Assessment and  Plan: 18 yo single female with long hx of polysubstance abuse who is now in sustained remission from alcohol/cannabis, alprazolam and early remission from opioids (heroin). She has a hx of anxiety (genalized, panic, social type), depression with two OD attempts very likely bipolar 2 disorder (depression). Several med trials not very meaningfull as she was abusing drugs at the same time. She is now on no psychotropic meds and came back to clinic (prevuously patient of Dr. Leanora CoverK. Hoover) and is interested in treatment of depression/anxiety. Retried on fluoxetine 20 mg - tolerates it well and reports decrease of anxiety and depression. Sleep normalized  after addition of low dose 25 mg of Seroquel. She denies abusing drugs. No SI, no psychosis.  Plan: Increase fluoxetine to 40 mg to hopefully achieve full remission of anxiety (and depression); continue Seroquel 25 mg at HS. Next med management visit in two months.   Magdalene Patricia, MD 03/08/2019, 11:13 AM

## 2019-04-23 ENCOUNTER — Telehealth (HOSPITAL_COMMUNITY): Payer: Self-pay | Admitting: Psychology

## 2019-04-25 ENCOUNTER — Telehealth (HOSPITAL_COMMUNITY): Payer: Self-pay | Admitting: Psychology

## 2019-05-01 ENCOUNTER — Ambulatory Visit (HOSPITAL_COMMUNITY): Payer: Commercial Managed Care - PPO | Admitting: Psychiatry

## 2019-05-09 ENCOUNTER — Ambulatory Visit: Payer: Commercial Managed Care - PPO | Admitting: Family Medicine

## 2019-07-04 ENCOUNTER — Telehealth: Payer: Self-pay | Admitting: Family Medicine

## 2019-07-04 NOTE — Telephone Encounter (Signed)
Dr. Banks please advise  

## 2019-07-04 NOTE — Telephone Encounter (Signed)
Copied from [place] [id number]. Topic: Quick Communication - Rx Refill/Question >> Jul 04, 2019 10:44 AM Robina Ade, Helene Kelp D wrote: Medication: gabapentin   Has the patient contacted their pharmacy? No (Agent: If no, request that the patient contact the pharmacy for the refill.) (Agent: If yes, when and what did the pharmacy advise?)  Preferred Pharmacy (with phone number or street name): [facility] [address], [place], [place] [address]  Phone: [phone]  Agent: Please be advised that RX refills may take up to 3 business days. We ask that you follow-up with your pharmacy.

## 2019-07-05 NOTE — Telephone Encounter (Signed)
Pt not previously on gabapentin.  Unclear if started by another provider at some point.

## 2019-07-06 ENCOUNTER — Other Ambulatory Visit: Payer: Self-pay

## 2019-07-06 ENCOUNTER — Telehealth (INDEPENDENT_AMBULATORY_CARE_PROVIDER_SITE_OTHER): Payer: Commercial Managed Care - PPO | Admitting: Family Medicine

## 2019-07-06 ENCOUNTER — Encounter: Payer: Self-pay | Admitting: Family Medicine

## 2019-07-06 DIAGNOSIS — R51 Headache: Secondary | ICD-10-CM | POA: Diagnosis not present

## 2019-07-06 DIAGNOSIS — R519 Headache, unspecified: Secondary | ICD-10-CM

## 2019-07-06 DIAGNOSIS — F1910 Other psychoactive substance abuse, uncomplicated: Secondary | ICD-10-CM | POA: Diagnosis not present

## 2019-07-06 NOTE — Telephone Encounter (Signed)
Patient had an OV w/ Dr. Volanda Napoleon today

## 2019-07-06 NOTE — Progress Notes (Signed)
Virtual Visit via Video Note  I connected with Lori Hull on 07/06/19 at  2:30 PM EDT by a video enabled telemedicine application 2/2 [id number] pandemic and verified that I am speaking with the correct person using two identifiers.  Location patient: home Location provider:work or home office Persons participating in the virtual visit: patient, provider  I discussed the limitations of evaluation and management by telemedicine and the availability of in person appointments. The patient expressed understanding and agreed to proceed.   HPI: Pt went to a detox program in [place] since last visit.  Currently at a sober living place in [place], seeing Psychiatry Terri Piedra.   Pt having neck and back pain which are causing HAs.  Started since pt stopped using opioids.  Pt sober x 30 days.  Pt endorses increased stress. Endorses poor sleep taking seroquel.  Taking meds for anxiety.  Pt states she has a list of her new meds that she can send to the office.  Pt requesting refill on Neurontin 300 mg BID [facility] [address], [place].  Pt has the rx bottle available.   ROS: See pertinent positives and negatives per HPI.  Past Medical History:  Diagnosis Date  . Allergy   . Anxiety   . Depression   . Heart murmur   . Protein deficiency ([place])   . Seizures ([place])     No past surgical history on file.  Family History  Problem Relation Age of Onset  . Anxiety disorder Mother   . Cancer Mother   . Anxiety disorder Brother   . Drug abuse Brother   . Alcohol abuse Paternal Grandfather   . Anxiety disorder Cousin   . Heart attack Father      Current Outpatient Medications:  .  FLUoxetine (PROZAC) 20 MG tablet, Take 2 tablets (40 mg total) by mouth daily., Disp: 60 tablet, Rfl: 1 .  naloxone (NARCAN) 2 MG/2ML injection, Inject into muscle for accidental opiate overdose, Disp: 2 mL, Rfl: 1 .  QUEtiapine (SEROQUEL) 25 MG tablet, Take 1 tablet (25 mg total) by  mouth at bedtime for 30 days., Disp: 30 tablet, Rfl: 0  EXAM:  VITALS per patient if applicable:  RR between 12-20 bpm  GENERAL: alert, oriented, appears well and in no acute distress  HEENT: atraumatic, conjunctiva clear, no obvious abnormalities on inspection of external nose and ears  NECK: normal movements of the head and neck  LUNGS: on inspection no signs of respiratory distress, breathing rate appears normal, no obvious gross SOB, gasping or wheezing  CV: no obvious cyanosis  MS: moves all visible extremities without noticeable abnormality  PSYCH/NEURO: pleasant and cooperative, no obvious depression or anxiety, speech and thought processing grossly intact  ASSESSMENT AND PLAN:  Discussed the following assessment and plan:  Persistent headaches  -avoid addictive medications given hx. -will obtain med list/recent treatment info. - Plan: gabapentin (NEURONTIN) 300 MG capsule  Substance abuse ([place])  -discussed need to obtain updated med list/records -pt congratulated, encouraged to continue sobriety  F/u in 2-4 wks.   I discussed the assessment and treatment plan with the patient. The patient was provided an opportunity to ask questions and all were answered. The patient agreed with the plan and demonstrated an understanding of the instructions.   The patient was advised to call back or seek an in-person evaluation if the symptoms worsen or if the condition fails to improve as anticipated.  [name], MD

## 2019-07-08 MED ORDER — GABAPENTIN 300 MG PO CAPS: 300.0000 mg | ORAL_CAPSULE | Freq: Two times a day (BID) | ORAL | 1 refills | Status: DC

## 2019-09-16 ENCOUNTER — Other Ambulatory Visit: Payer: Self-pay

## 2019-09-16 ENCOUNTER — Encounter (HOSPITAL_COMMUNITY): Payer: Self-pay

## 2019-09-16 ENCOUNTER — Ambulatory Visit (HOSPITAL_COMMUNITY)
Admission: EM | Admit: 2019-09-16 | Discharge: 2019-09-16 | Disposition: A | Discharge status: Home / Self Care | Payer: Commercial Managed Care - PPO

## 2019-09-16 DIAGNOSIS — R0981 Nasal congestion: Secondary | ICD-10-CM

## 2019-09-16 DIAGNOSIS — J00 Acute nasopharyngitis [common cold]: Secondary | ICD-10-CM

## 2019-09-16 DIAGNOSIS — R0982 Postnasal drip: Secondary | ICD-10-CM

## 2019-09-16 DIAGNOSIS — R070 Pain in throat: Secondary | ICD-10-CM

## 2019-09-16 MED ORDER — PSEUDOEPHEDRINE HCL 60 MG PO TABS: 60.0000 mg | ORAL_TABLET | Freq: Three times a day (TID) | ORAL | 0 refills | Status: DC | PRN

## 2019-09-16 MED ORDER — CETIRIZINE HCL 10 MG PO TABS: 10.0000 mg | ORAL_TABLET | Freq: Every day | ORAL | 0 refills | Status: DC

## 2019-09-16 NOTE — ED Triage Notes (Signed)
Patient presents to Urgent Care with complaints of runny nose since two days ago. Patient reports she has been using nasal spray with no relief.

## 2019-09-16 NOTE — ED Provider Notes (Signed)
MRN: [id number] DOB: 02-Jan-2001  Subjective:   Lori Hull is a 18 y.o. female presenting for 2-day history of runny and stuffy nose, sore throat.  Has not tried any medications for relief.  Patient smokes 1/2ppd. Patient refuses COVID 19 test. Works at [employer].   No current facility-administered medications for this encounter.   Current Outpatient Medications:  .  FLUoxetine (PROZAC) 20 MG tablet, Take 2 tablets (40 mg total) by mouth daily., Disp: 60 tablet, Rfl: 1 .  gabapentin (NEURONTIN) 300 MG capsule, Take 1 capsule (300 mg total) by mouth 2 (two) times daily., Disp: 60 capsule, Rfl: 1 .  propranolol (INDERAL) 20 MG tablet, Take 20 mg by mouth 3 (three) times daily., Disp: , Rfl:  .  QUEtiapine (SEROQUEL) 25 MG tablet, Take 1 tablet (25 mg total) by mouth at bedtime for 30 days., Disp: 30 tablet, Rfl: 0 .  tiZANidine (ZANAFLEX) 2 MG tablet, Take by mouth every 6 (six) hours as needed for muscle spasms., Disp: , Rfl:  .  naloxone (NARCAN) 2 MG/2ML injection, Inject into muscle for accidental opiate overdose, Disp: 2 mL, Rfl: 1   No Known Allergies  Past Medical History:  Diagnosis Date  . Allergy   . Anxiety   . Depression   . Heart murmur   . 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 Seizures ([place])      History reviewed. No pertinent surgical history.   Review of Systems  Constitutional: Negative for fever and malaise/fatigue.  HENT: Positive for congestion and sore throat. Negative for ear pain and sinus pain.   Eyes: Negative for discharge and redness.  Respiratory: Negative for cough, hemoptysis, shortness of breath and wheezing.   Cardiovascular: Negative for chest pain.  Gastrointestinal: Negative for abdominal pain, blood in stool, constipation, diarrhea, nausea and vomiting.  Genitourinary: Negative for dysuria, flank pain and hematuria.  Musculoskeletal: Negative for myalgias.  Skin: Negative for rash.  Neurological: Negative for dizziness, weakness and headaches.   Psychiatric/Behavioral: Negative for depression and substance abuse.    Objective:   Vitals: BP 105/75 (BP Location: Right Arm)   Pulse 98   Temp 98.3 F (36.8 C) (Oral)   Resp 18   SpO2 100%   Physical Exam Constitutional:      General: She is not in acute distress.    Appearance: Normal appearance. She is well-developed. She is not ill-appearing, toxic-appearing or diaphoretic.  HENT:     Head: Normocephalic and atraumatic.     Right Ear: Tympanic membrane and ear canal normal. No drainage or tenderness. No middle ear effusion. Tympanic membrane is not erythematous.     Left Ear: Tympanic membrane and ear canal normal. No drainage or tenderness.  No middle ear effusion. Tympanic membrane is not erythematous.     Nose: Congestion and rhinorrhea present.     Mouth/Throat:     Mouth: Mucous membranes are moist. No oral lesions.     Pharynx: No pharyngeal swelling, oropharyngeal exudate, posterior oropharyngeal erythema or uvula swelling.     Tonsils: No tonsillar exudate or tonsillar abscesses.     Comments: Significant postnasal drainage in oropharynx. Eyes:     General: No scleral icterus.    Extraocular Movements: Extraocular movements intact.     Right eye: Normal extraocular motion.     Left eye: Normal extraocular motion.     Conjunctiva/sclera: Conjunctivae normal.     Pupils: Pupils are equal, round, and reactive to light.  Neck:     Musculoskeletal: Normal  range of motion and neck supple.  Cardiovascular:     Rate and Rhythm: Normal rate and regular rhythm.     Pulses: Normal pulses.     Heart sounds: Normal heart sounds. No murmur. No friction rub. No gallop.   Pulmonary:     Effort: Pulmonary effort is normal. No respiratory distress.     Breath sounds: Normal breath sounds. No stridor. No wheezing, rhonchi or rales.  Lymphadenopathy:     Cervical: No cervical adenopathy.  Skin:    General: Skin is warm and dry.     Findings: No rash.  Neurological:      General: No focal deficit present.     Mental Status: She is alert and oriented to person, place, and time.  Psychiatric:        Mood and Affect: Mood normal.        Behavior: Behavior normal.        Thought Content: Thought content normal.     Assessment and Plan :   1. Acute rhinitis   2. Nasal congestion   3. Throat pain   4. Post-nasal drip     We will manage supportively for viral illness, viral rhinitis.  Counseled on possibility of COVID-19 given her exposure as she works at [employer].  Patient refused COVID-19 test. Counseled patient on potential for adverse effects with medications prescribed/recommended today, ER and return-to-clinic precautions discussed, patient verbalized understanding.    Wallis Bamberg, PA-C 09/16/19 1440

## 2019-09-16 NOTE — Discharge Instructions (Addendum)
For sore throat or cough try using a honey-based tea. Use 3 teaspoons of honey with juice squeezed from half lemon. Place shaved pieces of ginger into 1/2-1 cup of water and warm over stove top. Then mix the ingredients and repeat every 4 hours as needed. Please take Tylenol 500mg every 6 hours. Hydrate very well with at least 2 liters of water. Eat light meals such as soups to replenish electrolytes and soft fruits, veggies. Start an antihistamine like Zyrtec, Allegra or Claritin for postnasal drainage, sinus congestion.  You can take this together with pseudoephedrine (Sudafed) at a dose of 60 mg 3 times a day or 120 mg twice daily as needed for the same kind of congestion.    

## 2019-09-17 ENCOUNTER — Encounter: Payer: Self-pay | Admitting: Family Medicine

## 2019-09-17 ENCOUNTER — Other Ambulatory Visit: Payer: Self-pay | Admitting: Family Medicine

## 2019-09-17 DIAGNOSIS — R519 Headache, unspecified: Secondary | ICD-10-CM

## 2019-09-17 NOTE — Telephone Encounter (Signed)
Medication Refill - Medication: propranolol (INDERAL) 20 MG tablet, Quetiapine Er 300 mg tablet, Quetiapine Fumarate 100 mg tab, Gabapentin 300 mg capsule, Tizanidine Hcl 2 mg tab.  Has the patient contacted their pharmacy? Yes.   (Agent: If no, request that the patient contact the pharmacy for the refill.) (Agent: If yes, when and what did the pharmacy advise?)  Preferred Pharmacy (with phone number or street name): [facility] [phone] - [address], [place] - [place]: Please be advised that RX refills may take up to 3 business days. We ask that you follow-up with your pharmacy.

## 2019-09-17 NOTE — Telephone Encounter (Signed)
Message routed to PCP CMA  

## 2019-09-17 NOTE — Telephone Encounter (Signed)
Requested medication (s) are due for refill today: yes  Requested medication (s) are on the active medication list: yes  Last refill:  06/2019  Future visit scheduled: no  Notes to clinic: Patient requesting Quetiapine Er 300 mg tablet, Quetiapine Fumarate 100 not on current medication list   Requested Prescriptions  Pending Prescriptions Disp Refills   gabapentin (NEURONTIN) 300 MG capsule 60 capsule 1    Sig: Take 1 capsule (300 mg total) by mouth 2 (two) times daily.     Neurology: Anticonvulsants - gabapentin Passed - 09/17/2019 11:35 AM      Passed - Valid encounter within last 12 months    Recent Outpatient Visits          2 months ago Persistent headaches   Therapist, music at [place], [place]   7 months ago Substance abuse ([place])   Therapist, music at [place], MD   10 months ago Acute otitis media, unspecified otitis media type   Therapist, music at [facility], [place] C, MD   1 year ago Substance abuse ([place])   Therapist, music at [place], MD              propranolol (INDERAL) 20 MG tablet       Sig: Take 1 tablet (20 mg total) by mouth 3 (three) times daily.     Cardiovascular:  Beta Blockers Passed - 09/17/2019 11:35 AM      Passed - Last BP in normal range    BP Readings from Last 1 Encounters:  09/16/19 105/75         Passed - Last Heart Rate in normal range    Pulse Readings from Last 1 Encounters:  09/16/19 98         Passed - Valid encounter within last 6 months    Recent Outpatient Visits          2 months ago Persistent headaches   Therapist, music at [place], MD   7 months ago Substance abuse ([place])   Therapist, music at [place], MD   10 months ago Acute otitis media, unspecified otitis media type   Therapist, music at [facility], [place] C, MD   1 year ago Substance abuse ([place])   Therapist, music at [facility], Langley Adie, MD              QUEtiapine (SEROQUEL) 25 MG tablet 30 tablet 0    Sig: Take 1 tablet (25 mg total) by mouth at bedtime.     Not Delegated - Psychiatry:  Antipsychotics - Second Generation (Atypical) - quetiapine Failed - 09/17/2019 11:35 AM      Failed - This refill cannot be delegated      Failed - ALT in normal range and within 180 days    ALT  Date Value Ref Range Status  01/02/2018 19 14 - 54 U/L Final         Failed - AST in normal range and within 180 days    AST  Date Value Ref Range Status  01/02/2018 20 15 - 41 U/L Final         Passed - Last BP in normal range    BP Readings from Last 1 Encounters:  09/16/19 105/75         Passed - Valid encounter within last 6 months    Recent Outpatient Visits  2 months ago Persistent headaches   Nature conservation officer at [facility] [employer], Bettey Mare, MD   7 months ago Substance abuse (H[facility])   Adult nurse [facility]Care at [facility], Bettey Mare, MD   10 months ago Acute otitis media, unspecified otitis media type   Nature conservation officer at [facility], Paris Lore, MD   1 year ago Substance abuse (H[facility])   Nature conservation officer at [facility] [employer], Bettey Mare, MD             Passed - Completed PHQ-2 or PHQ-9 in the last 360 days.

## 2019-09-18 NOTE — Telephone Encounter (Signed)
Pt called and is requesting an update because she is completely out of this medication. Please advise.

## 2019-09-19 ENCOUNTER — Other Ambulatory Visit: Payer: Self-pay

## 2019-09-19 DIAGNOSIS — R519 Headache, unspecified: Secondary | ICD-10-CM

## 2019-09-19 MED ORDER — GABAPENTIN 300 MG PO CAPS: 300.0000 mg | ORAL_CAPSULE | Freq: Two times a day (BID) | ORAL | 1 refills | Status: DC

## 2019-09-19 MED ORDER — PROPRANOLOL HCL 20 MG PO TABS: 20.0000 mg | ORAL_TABLET | Freq: Three times a day (TID) | ORAL | 1 refills | Status: DC

## 2019-09-19 MED ORDER — QUETIAPINE FUMARATE 25 MG PO TABS: 25.0000 mg | ORAL_TABLET | Freq: Every day | ORAL | 1 refills | Status: DC

## 2019-09-19 NOTE — Telephone Encounter (Signed)
Spoke with pt aware that her Rx was sent to the  Pharmacy,pt aware to get a psychiatrist around her area for evaluation

## 2019-09-19 NOTE — Telephone Encounter (Signed)
Pt was last seen on 07/06/2019, pt gabapentin and Propranolol have been sent to requested pharmacy, pt requesting refill on  Quetiapine, Tizanidine last filled by historical provider. Pleas advise

## 2019-09-19 NOTE — Telephone Encounter (Signed)
Pt called to investigate status of other refills, and stated that she needs 100 MG for Seroquel, she also takes the 300 MG XR for nightly usage

## 2019-09-19 NOTE — Telephone Encounter (Signed)
Ok to refill for 30 days with 1 refill until pt can find a psychiatrist in the area.

## 2019-09-20 ENCOUNTER — Telehealth: Payer: Self-pay | Admitting: Family Medicine

## 2019-09-20 NOTE — Telephone Encounter (Signed)
Pt has called in to follow-up with the medications list that she had sent in.   Pt also state that she was discharged on 09/19/2019 by the psychiatrist and they made some changes in it.  Pt would like to have a call back to discuss.

## 2019-09-21 NOTE — Telephone Encounter (Signed)
This request is already taken care of and pt is aware

## 2019-09-25 NOTE — Telephone Encounter (Signed)
Spoke with pt aware that Rx was sent to her pharmacy 

## 2019-09-27 ENCOUNTER — Other Ambulatory Visit: Payer: Self-pay

## 2019-09-27 ENCOUNTER — Encounter (HOSPITAL_COMMUNITY): Payer: Self-pay | Admitting: Emergency Medicine

## 2019-09-27 ENCOUNTER — Ambulatory Visit (HOSPITAL_COMMUNITY)
Admission: EM | Admit: 2019-09-27 | Discharge: 2019-09-27 | Disposition: A | Discharge status: Home / Self Care | Payer: Commercial Managed Care - PPO | Attending: Family Medicine | Admitting: Family Medicine

## 2019-09-27 DIAGNOSIS — R1084 Generalized abdominal pain: Secondary | ICD-10-CM

## 2019-09-27 NOTE — Discharge Instructions (Signed)
Drink plenty of water Increase fiber in diet

## 2019-09-27 NOTE — ED Provider Notes (Signed)
[facility]    CSN: [id number] Arrival date & time: 09/27/19  1837      History   Chief Complaint Chief Complaint  Patient presents with  . Abdominal Pain  . Letter for School/Work    HPI Lori Hull is a 18 y.o. female.   HPI  Patient states that she woke up this morning.  She had abdominal crampy pain.  She felt queasy.  She tried to eat some leftover pizza from last night and she threw it up.  Later in the day she was eating normally.  Because she felt that this morning she called out from work.  She later learned that she needs to have a note in order to return to work tomorrow.  No fever or chills.  She has not had a bowel movement for 2 to 3 days.  We discussed she needs fiber, fluids, walking every day, perhaps a stool softener, or MiraLAX/Metamucil type supplement  Past Medical History:  Diagnosis Date  . Allergy   . Anxiety   . Depression   . Heart murmur   . Protein deficiency (HCC)   . Seizures [facility](HCC)     Patient Active Problem List   Diagnosis Date Noted  . Bipolar 2 disorder (HCC) 03/08/2019  . GAD (generalized anxiety disorder) 02/05/2019  . Panic disorder 02/05/2019  . Cannabis use disorder, moderate, in sustained remission (HCC) 02/05/2019  . 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 Social anxiety disorder 02/05/2019  . Opioid use disorder, moderate, in early remission, dependence (HCC) 02/05/2019  . Weakness acquired in ICU 05/20/2017  . Marijuana abuse 05/18/2017  . 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 Moderate episode of recurrent major depressive disorder (HCC)   . Overdose 05/14/2017    History reviewed. No pertinent surgical history.  OB History   No obstetric history on file.      Home Medications    Prior to Admission medications   Medication Sig Start Date End Date Taking? Authorizing Provider  FLUoxetine HCl (PROZAC PO) Take by mouth.   Yes [provider]  gabapentin  (NEURONTIN) 300 MG capsule Take 1 capsule (300 mg total) by mouth 2 (two) times daily. 09/19/19  Yes Deeann SaintBanks, Shannon R, MD  propranolol (INDERAL) 20 MG tablet Take 1 tablet (20 mg total) by mouth 3 (three) times daily. 09/19/19  Yes Deeann SaintBanks, Shannon R, MD  pseudoephedrine (SUDAFED) 60 MG tablet Take 1 tablet (60 mg total) by mouth every 8 (eight) hours as needed for congestion. 09/16/19  Yes Wallis BambergMani, Mario, PA-C  QUEtiapine (SEROQUEL) 25 MG tablet Take 1 tablet (25 mg total) by mouth at bedtime. 09/19/19 10/19/19 Yes Deeann SaintBanks, Shannon R, MD  tiZANidine (ZANAFLEX) 2 MG tablet Take by mouth every 6 (six) hours as needed for muscle spasms.   Yes [provider]  cetirizine (ZYRTEC ALLERGY) 10 MG tablet Take 1 tablet (10 mg total) by mouth daily. 09/16/19   Wallis BambergMani, Mario, PA-C  FLUoxetine (PROZAC) 20 MG tablet Take 2 tablets (40 mg total) by mouth daily. 03/08/19 09/16/19  Pucilowski, Roosvelt Maserlgierd A, MD  naloxone Jonelle Sports(NARCAN) 2 MG/2ML injection Inject into muscle for accidental opiate overdose 10/18/18   Linwood DibblesKnapp, Jon, MD    Family History Family History  Problem Relation Age of Onset  . Anxiety disorder Mother   . Cancer Mother        breast  . Anxiety disorder Brother   . Drug abuse Brother   . Alcohol abuse Paternal Grandfather   .  Anxiety disorder Cousin   . Heart attack Father     Social History Social History   Tobacco Use  . Smoking status: Current Some Day Smoker    Packs/day: 0.50    Years: 0.60    Pack years: 0.30    Types: Cigarettes  . Smokeless tobacco: Never Used  Substance Use Topics  . Alcohol use: Not Currently  . Drug use: Not Currently    Types: Marijuana, Heroin, Other-see comments    Comment: Heroin: Last PTA, daily basis      Allergies   Patient has no known allergies.   Review of Systems Review of Systems  Constitutional: Negative for chills and fever.  HENT: Negative for ear pain and sore throat.   Eyes: Negative for pain and visual disturbance.  Respiratory:  Negative for cough and shortness of breath.   Cardiovascular: Negative for chest pain and palpitations.  Gastrointestinal: Positive for abdominal pain, constipation, nausea and vomiting.  Genitourinary: Negative for dysuria and hematuria.  Musculoskeletal: Negative for arthralgias and back pain.  Skin: Negative for color change and rash.  Neurological: Negative for seizures and syncope.  All other systems reviewed and are negative.    Physical Exam Triage Vital Signs ED Triage Vitals  Enc Vitals Group     BP 09/27/19 1859 102/67     Pulse Rate 09/27/19 1859 85     Resp 09/27/19 1859 18     Temp --      Temp src --      SpO2 09/27/19 1859 97 %     Weight --      Height --      Head Circumference --      Peak Flow --      Pain Score 09/27/19 1854 1     Pain Loc --      Pain Edu? --      Excl. in GC? --    No data found.  Updated Vital Signs BP 102/67 (BP Location: Right Arm)   Pulse 85   Resp 18   SpO2 97%   Physical Exam Constitutional:      General: She is not in acute distress.    Appearance: She is well-developed and normal weight.  HENT:     Head: Normocephalic and atraumatic.     Mouth/Throat:     Comments: Mask in place Eyes:     Conjunctiva/sclera: Conjunctivae normal.     Pupils: Pupils are equal, round, and reactive to light.  Neck:     Musculoskeletal: Normal range of motion.  Cardiovascular:     Rate and Rhythm: Normal rate and regular rhythm.     Heart sounds: Normal heart sounds.  Pulmonary:     Effort: Pulmonary effort is normal. No respiratory distress.  Abdominal:     General: Abdomen is flat. Bowel sounds are normal. There is no distension.     Palpations: Abdomen is soft.     Tenderness: There is no abdominal tenderness.  Musculoskeletal: Normal range of motion.  Skin:    General: Skin is warm and dry.  Neurological:     General: No focal deficit present.     Mental Status: She is alert.  Psychiatric:        Mood and Affect: Mood  normal.        Behavior: Behavior normal.      UC Treatments / Results  Labs (all labs ordered are listed, but only abnormal results are displayed) Labs Reviewed - No  data to display  EKG   Radiology No results found.  Procedures Procedures (including critical care time)  Medications Ordered in UC Medications - No data to display  Initial Impression / Assessment and Plan / UC Course  I have reviewed the triage vital signs and the nursing notes.  Pertinent labs & imaging results that were available during my care of the patient were reviewed by me and considered in my medical decision making (see chart for details).      Recommend smoking cessation Recommend improved diet and water intake Final Clinical Impressions(s) / UC Diagnoses   Final diagnoses:  Generalized abdominal pain     Discharge Instructions     Drink plenty of water Increase fiber in diet   ED Prescriptions    None     PDMP not reviewed this encounter.   Raylene Everts, MD 09/27/19 [phone]

## 2019-09-27 NOTE — ED Triage Notes (Addendum)
Reports abdominal pain, nausea, vomited x 1 this morning.  Denies diarrhea.  "Lori Hull been pretty constipated actually"  Patient specifically requesting a work note

## 2019-10-01 ENCOUNTER — Emergency Department (HOSPITAL_COMMUNITY): Payer: Commercial Managed Care - PPO

## 2019-10-01 ENCOUNTER — Inpatient Hospital Stay (HOSPITAL_COMMUNITY)
Admission: EM | Admit: 2019-10-01 | Discharge: 2019-10-03 | DRG: 163 | Disposition: A | Discharge status: Home / Self Care | Payer: Commercial Managed Care - PPO | Attending: Surgery | Admitting: Surgery

## 2019-10-01 ENCOUNTER — Emergency Department (HOSPITAL_COMMUNITY): Payer: Commercial Managed Care - PPO | Admitting: Anesthesiology

## 2019-10-01 ENCOUNTER — Encounter (HOSPITAL_COMMUNITY): Payer: Self-pay | Admitting: Emergency Medicine

## 2019-10-01 ENCOUNTER — Encounter (HOSPITAL_COMMUNITY): Admission: EM | Disposition: A | Discharge status: Home / Self Care | Payer: Self-pay | Source: Home / Self Care

## 2019-10-01 DIAGNOSIS — Z813 Family history of other psychoactive substance abuse and dependence: Secondary | ICD-10-CM | POA: Diagnosis not present

## 2019-10-01 DIAGNOSIS — Z818 Family history of other mental and behavioral disorders: Secondary | ICD-10-CM | POA: Diagnosis not present

## 2019-10-01 DIAGNOSIS — S80211A Abrasion, right knee, initial encounter: Secondary | ICD-10-CM | POA: Diagnosis present

## 2019-10-01 DIAGNOSIS — F17210 Nicotine dependence, cigarettes, uncomplicated: Secondary | ICD-10-CM | POA: Diagnosis present

## 2019-10-01 DIAGNOSIS — S2509XA Other specified injury of thoracic aorta, initial encounter: Secondary | ICD-10-CM

## 2019-10-01 DIAGNOSIS — S2501XA Minor laceration of thoracic aorta, initial encounter: Secondary | ICD-10-CM | POA: Diagnosis present

## 2019-10-01 DIAGNOSIS — S27892A Contusion of other specified intrathoracic organs, initial encounter: Principal | ICD-10-CM | POA: Diagnosis present

## 2019-10-01 DIAGNOSIS — F411 Generalized anxiety disorder: Secondary | ICD-10-CM | POA: Diagnosis present

## 2019-10-01 DIAGNOSIS — I712 Thoracic aortic aneurysm, without rupture, unspecified: Secondary | ICD-10-CM

## 2019-10-01 DIAGNOSIS — Z20828 Contact with and (suspected) exposure to other viral communicable diseases: Secondary | ICD-10-CM | POA: Diagnosis present

## 2019-10-01 DIAGNOSIS — F3181 Bipolar II disorder: Secondary | ICD-10-CM | POA: Diagnosis present

## 2019-10-01 DIAGNOSIS — I71019 Dissection of thoracic aorta, unspecified: Secondary | ICD-10-CM

## 2019-10-01 DIAGNOSIS — Z8249 Family history of ischemic heart disease and other diseases of the circulatory system: Secondary | ICD-10-CM | POA: Diagnosis not present

## 2019-10-01 DIAGNOSIS — S2222XA Fracture of body of sternum, initial encounter for closed fracture: Secondary | ICD-10-CM

## 2019-10-01 DIAGNOSIS — S22011A Stable burst fracture of first thoracic vertebra, initial encounter for closed fracture: Secondary | ICD-10-CM | POA: Diagnosis present

## 2019-10-01 DIAGNOSIS — Z452 Encounter for adjustment and management of vascular access device: Secondary | ICD-10-CM

## 2019-10-01 DIAGNOSIS — Z79899 Other long term (current) drug therapy: Secondary | ICD-10-CM

## 2019-10-01 DIAGNOSIS — S22009A Unspecified fracture of unspecified thoracic vertebra, initial encounter for closed fracture: Secondary | ICD-10-CM

## 2019-10-01 DIAGNOSIS — I7101 Dissection of thoracic aorta: Secondary | ICD-10-CM | POA: Diagnosis present

## 2019-10-01 DIAGNOSIS — Z809 Family history of malignant neoplasm, unspecified: Secondary | ICD-10-CM

## 2019-10-01 DIAGNOSIS — Y92410 Unspecified street and highway as the place of occurrence of the external cause: Secondary | ICD-10-CM

## 2019-10-01 HISTORY — PX: THORACIC AORTIC ENDOVASCULAR STENT GRAFT: SHX6112

## 2019-10-01 HISTORY — PX: ENDARTERECTOMY FEMORAL: SHX5804

## 2019-10-01 LAB — CBC WITH DIFFERENTIAL/PLATELET
Abs Immature Granulocytes: 0.03 10*3/uL (ref 0.00–0.07)
Basophils Absolute: 0 10*3/uL (ref 0.0–0.1)
Basophils Relative: 0 %
Eosinophils Absolute: 0.3 10*3/uL (ref 0.0–0.5)
Eosinophils Relative: 4 %
HCT: 38.9 % (ref 36.0–46.0)
Hemoglobin: 12.8 g/dL (ref 12.0–15.0)
Immature Granulocytes: 0 %
Lymphocytes Relative: 16 %
Lymphs Abs: 1.4 10*3/uL (ref 0.7–4.0)
MCH: 31.1 pg (ref 26.0–34.0)
MCHC: 32.9 g/dL (ref 30.0–36.0)
MCV: 94.4 fL (ref 80.0–100.0)
Monocytes Absolute: 0.7 10*3/uL (ref 0.1–1.0)
Monocytes Relative: 7 %
Neutro Abs: 6.6 10*3/uL (ref 1.7–7.7)
Neutrophils Relative %: 73 %
Platelets: 279 10*3/uL (ref 150–400)
RBC: 4.12 MIL/uL (ref 3.87–5.11)
RDW: 11.8 % (ref 11.5–15.5)
WBC: 9.1 10*3/uL (ref 4.0–10.5)
nRBC: 0 % (ref 0.0–0.2)

## 2019-10-01 LAB — COMPREHENSIVE METABOLIC PANEL
ALT: 70 U/L — ABNORMAL HIGH (ref 0–44)
AST: 140 U/L — ABNORMAL HIGH (ref 15–41)
Albumin: 3.6 g/dL (ref 3.5–5.0)
Alkaline Phosphatase: 89 U/L (ref 38–126)
Anion gap: 11 (ref 5–15)
BUN: 11 mg/dL (ref 6–20)
CO2: 23 mmol/L (ref 22–32)
Calcium: 9.1 mg/dL (ref 8.9–10.3)
Chloride: 103 mmol/L (ref 98–111)
Creatinine, Ser: 0.91 mg/dL (ref 0.44–1.00)
GFR calc Af Amer: >60 mL/min (ref >60)
GFR calc non Af Amer: >60 mL/min (ref >60)
Glucose, Bld: 114 mg/dL — ABNORMAL HIGH (ref 70–99)
Potassium: 4.6 mmol/L (ref 3.5–5.1)
Sodium: 137 mmol/L (ref 135–145)
Total Bilirubin: 0.8 mg/dL (ref 0.3–1.2)
Total Protein: 6.3 g/dL — ABNORMAL LOW (ref 6.5–8.1)

## 2019-10-01 LAB — I-STAT CHEM 8, ED
BUN: 11 mg/dL (ref 6–20)
Calcium, Ion: 1.21 mmol/L (ref 1.15–1.40)
Chloride: 102 mmol/L (ref 98–111)
Creatinine, Ser: 0.9 mg/dL (ref 0.44–1.00)
Glucose, Bld: 101 mg/dL — ABNORMAL HIGH (ref 70–99)
HCT: 40 % (ref 36.0–46.0)
Hemoglobin: 13.6 g/dL (ref 12.0–15.0)
Potassium: 4.4 mmol/L (ref 3.5–5.1)
Sodium: 141 mmol/L (ref 135–145)
TCO2: 28 mmol/L (ref 22–32)

## 2019-10-01 LAB — I-STAT BETA HCG BLOOD, ED (MC, WL, AP ONLY): I-stat hCG, quantitative: <5 m[IU]/mL (ref <5)

## 2019-10-01 LAB — SARS CORONAVIRUS 2 BY RT PCR (HOSPITAL ORDER, PERFORMED IN ~~LOC~~ HOSPITAL LAB): SARS Coronavirus 2: NEGATIVE

## 2019-10-01 LAB — ABO/RH: ABO/RH(D): A POS

## 2019-10-01 LAB — PREPARE RBC (CROSSMATCH)

## 2019-10-01 SURGERY — INSERTION, ENDOVASCULAR STENT GRAFT, AORTA, THORACIC
Anesthesia: General

## 2019-10-01 MED ORDER — CEFAZOLIN SODIUM-DEXTROSE 2-3 GM-%(50ML) IV SOLR
INTRAVENOUS | Status: DC | PRN
  Administered 2019-10-01: 2 g via INTRAVENOUS

## 2019-10-01 MED ORDER — ONDANSETRON HCL 4 MG/2ML IJ SOLN
INTRAMUSCULAR | Status: AC
  Filled 2019-10-01: qty 2

## 2019-10-01 MED ORDER — MIDAZOLAM HCL 5 MG/5ML IJ SOLN
INTRAMUSCULAR | Status: DC | PRN
  Administered 2019-10-01: 2 mg via INTRAVENOUS

## 2019-10-01 MED ORDER — PHENYLEPHRINE 40 MCG/ML (10ML) SYRINGE FOR IV PUSH (FOR BLOOD PRESSURE SUPPORT)
PREFILLED_SYRINGE | INTRAVENOUS | Status: AC
  Filled 2019-10-01: qty 10

## 2019-10-01 MED ORDER — LIDOCAINE HCL (CARDIAC) PF 100 MG/5ML IV SOSY
PREFILLED_SYRINGE | INTRAVENOUS | Status: DC | PRN
  Administered 2019-10-01: 60 mg via INTRATRACHEAL

## 2019-10-01 MED ORDER — IODIXANOL 320 MG/ML IV SOLN
INTRAVENOUS | Status: DC | PRN
  Administered 2019-10-01: 50 mL via INTRA_ARTERIAL

## 2019-10-01 MED ORDER — SUFENTANIL CITRATE 50 MCG/ML IV SOLN
INTRAVENOUS | Status: AC
  Filled 2019-10-01: qty 1

## 2019-10-01 MED ORDER — SUFENTANIL CITRATE 50 MCG/ML IV SOLN
INTRAVENOUS | Status: DC | PRN
  Administered 2019-10-01: 30 ug via INTRAVENOUS
  Administered 2019-10-01 (×2): 10 ug via INTRAVENOUS

## 2019-10-01 MED ORDER — LACTATED RINGERS IV SOLN
INTRAVENOUS | Status: DC | PRN
  Administered 2019-10-01 – 2019-10-02 (×3): via INTRAVENOUS

## 2019-10-01 MED ORDER — SUCCINYLCHOLINE CHLORIDE 200 MG/10ML IV SOSY
PREFILLED_SYRINGE | INTRAVENOUS | Status: AC
  Filled 2019-10-01: qty 10

## 2019-10-01 MED ORDER — ROCURONIUM BROMIDE 10 MG/ML (PF) SYRINGE
PREFILLED_SYRINGE | INTRAVENOUS | Status: AC
  Filled 2019-10-01: qty 10

## 2019-10-01 MED ORDER — ROCURONIUM BROMIDE 100 MG/10ML IV SOLN
INTRAVENOUS | Status: DC | PRN
  Administered 2019-10-01: 10 mg via INTRAVENOUS
  Administered 2019-10-01: 50 mg via INTRAVENOUS
  Administered 2019-10-02: 10 mg via INTRAVENOUS

## 2019-10-01 MED ORDER — SUCCINYLCHOLINE CHLORIDE 20 MG/ML IJ SOLN
INTRAMUSCULAR | Status: DC | PRN
  Administered 2019-10-01: 120 mg via INTRAVENOUS

## 2019-10-01 MED ORDER — CEFAZOLIN SODIUM 1 G IJ SOLR
INTRAMUSCULAR | Status: AC
  Filled 2019-10-01: qty 20

## 2019-10-01 MED ORDER — ALBUMIN HUMAN 5 % IV SOLN
INTRAVENOUS | Status: DC | PRN
  Administered 2019-10-01: 23:00:00 via INTRAVENOUS

## 2019-10-01 MED ORDER — PROPOFOL 10 MG/ML IV BOLUS
INTRAVENOUS | Status: DC | PRN
  Administered 2019-10-01: 130 mg via INTRAVENOUS

## 2019-10-01 MED ORDER — ONDANSETRON HCL 4 MG/2ML IJ SOLN: 4.0000 mg | Freq: Once | INTRAMUSCULAR | Status: DC | PRN

## 2019-10-01 MED ORDER — SODIUM CHLORIDE 0.9 % IV SOLN
INTRAVENOUS | Status: DC | PRN
  Administered 2019-10-01: 500 mL

## 2019-10-01 MED ORDER — KETAMINE HCL 50 MG/5ML IJ SOSY
PREFILLED_SYRINGE | INTRAMUSCULAR | Status: AC
  Filled 2019-10-01: qty 5

## 2019-10-01 MED ORDER — SODIUM CHLORIDE 0.9 % IV SOLN
INTRAVENOUS | Status: AC
  Filled 2019-10-01: qty 1.2

## 2019-10-01 MED ORDER — SODIUM CHLORIDE (PF) 0.9 % IJ SOLN
INTRAMUSCULAR | Status: AC
  Filled 2019-10-01: qty 20

## 2019-10-01 MED ORDER — MIDAZOLAM HCL 2 MG/2ML IJ SOLN
INTRAMUSCULAR | Status: AC
  Filled 2019-10-01: qty 2

## 2019-10-01 MED ORDER — ARTIFICIAL TEARS OPHTHALMIC OINT
TOPICAL_OINTMENT | OPHTHALMIC | Status: AC
  Filled 2019-10-01: qty 3.5

## 2019-10-01 MED ORDER — IOHEXOL 300 MG/ML  SOLN
100.0000 mL | Freq: Once | INTRAMUSCULAR | Status: AC | PRN
  Administered 2019-10-01: 100 mL via INTRAVENOUS

## 2019-10-01 MED ORDER — PROPOFOL 10 MG/ML IV BOLUS
INTRAVENOUS | Status: AC
  Filled 2019-10-01: qty 20

## 2019-10-01 MED ORDER — FENTANYL CITRATE (PF) 100 MCG/2ML IJ SOLN: 25.0000 ug | INTRAMUSCULAR | Status: DC | PRN

## 2019-10-01 MED ORDER — EPHEDRINE 5 MG/ML INJ
INTRAVENOUS | Status: AC
  Filled 2019-10-01: qty 10

## 2019-10-01 MED ORDER — FENTANYL CITRATE (PF) 100 MCG/2ML IJ SOLN
50.0000 ug | Freq: Once | INTRAMUSCULAR | Status: AC
  Administered 2019-10-01: 50 ug via INTRAVENOUS
  Filled 2019-10-01: qty 2

## 2019-10-01 MED ORDER — LIDOCAINE 2% (20 MG/ML) 5 ML SYRINGE
INTRAMUSCULAR | Status: AC
  Filled 2019-10-01: qty 5

## 2019-10-01 MED ORDER — OXYCODONE HCL 5 MG/5ML PO SOLN: 5.0000 mg | Freq: Once | ORAL | Status: DC | PRN

## 2019-10-01 MED ORDER — OXYCODONE HCL 5 MG PO TABS: 5.0000 mg | ORAL_TABLET | Freq: Once | ORAL | Status: DC | PRN

## 2019-10-01 MED ORDER — 0.9 % SODIUM CHLORIDE (POUR BTL) OPTIME
TOPICAL | Status: DC | PRN
  Administered 2019-10-01: 1000 mL

## 2019-10-01 MED ORDER — KETAMINE HCL 10 MG/ML IJ SOLN
INTRAMUSCULAR | Status: DC | PRN
  Administered 2019-10-01 (×2): 10 mg via INTRAVENOUS
  Administered 2019-10-01: 30 mg via INTRAVENOUS

## 2019-10-01 MED ORDER — PHENYLEPHRINE HCL (PRESSORS) 10 MG/ML IV SOLN
INTRAVENOUS | Status: DC | PRN
  Administered 2019-10-01 – 2019-10-02 (×3): 40 ug via INTRAVENOUS

## 2019-10-01 MED ORDER — HEPARIN SODIUM (PORCINE) 1000 UNIT/ML IJ SOLN
INTRAMUSCULAR | Status: AC
  Filled 2019-10-01: qty 1

## 2019-10-01 SURGICAL SUPPLY — 62 items
BLADE SURG 15 STRL LF DISP TIS (BLADE) ×2 IMPLANT
BLADE SURG 15 STRL SS (BLADE) ×1
CANISTER SUCT 3000ML PPV (MISCELLANEOUS) ×3 IMPLANT
CANNULA VESSEL 3MM 2 BLNT TIP (CANNULA) ×6 IMPLANT
CATH ACCU-VU SIZ PIG 5F 100CM (CATHETERS) ×3 IMPLANT
CATH BEACON 5.038 65CM KMP-01 (CATHETERS) IMPLANT
CLIP VESOCCLUDE MED 6/CT (CLIP) ×3 IMPLANT
CLIP VESOCCLUDE SM WIDE 6/CT (CLIP) ×3 IMPLANT
COVER PROBE W GEL 5X96 (DRAPES) ×3 IMPLANT
COVER SURGICAL LIGHT HANDLE (MISCELLANEOUS) ×3 IMPLANT
COVER WAND RF STERILE (DRAPES) ×3 IMPLANT
DERMABOND ADVANCED (GAUZE/BANDAGES/DRESSINGS) ×1
DERMABOND ADVANCED .7 DNX12 (GAUZE/BANDAGES/DRESSINGS) ×2 IMPLANT
DEVICE CLOSURE PERCLS PRGLD 6F (VASCULAR PRODUCTS) ×8 IMPLANT
DRSG TEGADERM 2-3/8X2-3/4 SM (GAUZE/BANDAGES/DRESSINGS) ×3 IMPLANT
DRYSEAL FLEXSHEATH 20FR 33CM (SHEATH) ×1
ELECT REM PT RETURN 9FT ADLT (ELECTROSURGICAL) ×6
ELECTRODE REM PT RTRN 9FT ADLT (ELECTROSURGICAL) ×4 IMPLANT
GLOVE BIO SURGEON STRL SZ7.5 (GLOVE) ×6 IMPLANT
GLOVE BIOGEL PI IND STRL 7.0 (GLOVE) ×4 IMPLANT
GLOVE BIOGEL PI IND STRL 7.5 (GLOVE) ×4 IMPLANT
GLOVE BIOGEL PI INDICATOR 7.0 (GLOVE) ×2
GLOVE BIOGEL PI INDICATOR 7.5 (GLOVE) ×2
GLOVE SURG SS PI 7.5 STRL IVOR (GLOVE) ×6 IMPLANT
GOWN STRL REUS W/ TWL LRG LVL3 (GOWN DISPOSABLE) ×6 IMPLANT
GOWN STRL REUS W/TWL LRG LVL3 (GOWN DISPOSABLE) ×3
KIT BASIN OR (CUSTOM PROCEDURE TRAY) ×3 IMPLANT
KIT TURNOVER KIT B (KITS) ×3 IMPLANT
LOOP VESSEL MAXI BLUE (MISCELLANEOUS) ×6 IMPLANT
LOOP VESSEL MINI RED (MISCELLANEOUS) ×6 IMPLANT
NEEDLE PERC 18GX7CM (NEEDLE) ×3 IMPLANT
NS IRRIG 1000ML POUR BTL (IV SOLUTION) ×3 IMPLANT
PACK ENDOVASCULAR (PACKS) ×3 IMPLANT
PAD ARMBOARD 7.5X6 YLW CONV (MISCELLANEOUS) ×6 IMPLANT
PATCH VASC XENOSURE 1CMX6CM (Vascular Products) ×1 IMPLANT
PATCH VASC XENOSURE 1X6 (Vascular Products) ×2 IMPLANT
PENCIL BUTTON HOLSTER BLD 10FT (ELECTRODE) ×3 IMPLANT
PERCLOSE PROGLIDE 6F (VASCULAR PRODUCTS) ×12
SHEATH DRYSEAL FLEX 20FR 33CM (SHEATH) ×2 IMPLANT
SHEATH PINNACLE 8F 10CM (SHEATH) ×3 IMPLANT
SPONGE SURGIFOAM ABS GEL 100 (HEMOSTASIS) IMPLANT
STENT GRFT THORAC ACS 26X21X10 (Endovascular Graft) ×3 IMPLANT
STOPCOCK MORSE 400PSI 3WAY (MISCELLANEOUS) ×3 IMPLANT
SUT PROLENE 3 0 SH1 36 (SUTURE) IMPLANT
SUT PROLENE 5 0 C 1 24 (SUTURE) ×3 IMPLANT
SUT PROLENE 6 0 CC (SUTURE) ×6 IMPLANT
SUT SILK 3 0 (SUTURE) ×1
SUT SILK 3-0 18XBRD TIE 12 (SUTURE) ×2 IMPLANT
SUT VIC AB 2-0 SH 27 (SUTURE) ×2
SUT VIC AB 2-0 SH 27XBRD (SUTURE) ×4 IMPLANT
SUT VIC AB 3-0 SH 27 (SUTURE) ×1
SUT VIC AB 3-0 SH 27X BRD (SUTURE) ×2 IMPLANT
SUT VICRYL 4-0 PS2 18IN ABS (SUTURE) ×3 IMPLANT
SYR 3ML LL SCALE MARK (SYRINGE) ×3 IMPLANT
SYR 50ML LL SCALE MARK (SYRINGE) IMPLANT
SYR BULB IRRIGATION 50ML (SYRINGE) ×3 IMPLANT
SYR MEDRAD MARK V 150ML (SYRINGE) ×3 IMPLANT
TOWEL GREEN STERILE (TOWEL DISPOSABLE) ×3 IMPLANT
TRAY FOLEY MTR SLVR 16FR STAT (SET/KITS/TRAYS/PACK) ×3 IMPLANT
TUBING HIGH PRESSURE 120CM (CONNECTOR) ×6 IMPLANT
WIRE BENTSON .035X145CM (WIRE) ×3 IMPLANT
WIRE STIFF LUNDERQUIST 260CM (WIRE) ×3 IMPLANT

## 2019-10-01 NOTE — Anesthesia Procedure Notes (Signed)
Central Venous Catheter Insertion Performed by: Roberts Gaudy, MD, anesthesiologist Start/End11/07/2019 8:00 PM, 10/01/2019 8:10 PM Patient location: Pre-op. Preanesthetic checklist: patient identified, IV checked, site marked, risks and benefits discussed, surgical consent, monitors and equipment checked, pre-op evaluation, timeout performed and anesthesia consent Lidocaine 1% used for infiltration and patient sedated Hand hygiene performed  and maximum sterile barriers used  Catheter size: 8 Fr Total catheter length 16. Central line was placed.Double lumen Procedure performed using ultrasound guided technique. Ultrasound Notes:image(s) printed for medical record Attempts: 1 Following insertion, dressing applied and line sutured. Post procedure assessment: blood return through all ports  Patient tolerated the procedure well with no immediate complications.
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
## 2019-10-01 NOTE — ED Notes (Signed)
Report given to [facility]. cardiothoracic Surgery at bedside consent being signed.
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
## 2019-10-01 NOTE — ED Notes (Signed)
Pt reached her mom on phone

## 2019-10-01 NOTE — Anesthesia Procedure Notes (Signed)
Procedure Name: Intubation Performed by: Clovis Cao, CRNA Pre-anesthesia Checklist: Patient identified, Emergency Drugs available, Suction available, Patient being monitored and Timeout performed Patient Re-evaluated:Patient Re-evaluated prior to induction Oxygen Delivery Method: Circle system utilized Preoxygenation: Pre-oxygenation with 100% oxygen Induction Type: IV induction, Rapid sequence and Cricoid Pressure applied Laryngoscope Size: Miller and 2 Grade View: Grade I Tube type: Oral Tube size: 7.5 mm Number of attempts: 1 Airway Equipment and Method: Stylet Placement Confirmation: ETT inserted through vocal cords under direct vision,  positive ETCO2 and breath sounds checked- equal and bilateral Secured at: 22 cm Tube secured with: Tape Dental Injury: Teeth and Oropharynx as per pre-operative assessment

## 2019-10-01 NOTE — Anesthesia Preprocedure Evaluation (Signed)
Anesthesia Evaluation  Patient identified by MRN, date of birth, ID band Patient awake    Reviewed: Allergy & Precautions, NPO status , Patient's Chart, lab work & pertinent test results  Airway Mallampati: II  TM Distance: >3 FB Neck ROM: Full    Dental  (+) Teeth Intact, Dental Advisory Given   Pulmonary Current Smoker,    breath sounds clear to auscultation       Cardiovascular  Rhythm:Regular Rate:Normal     Neuro/Psych    GI/Hepatic   Endo/Other    Renal/GU      Musculoskeletal   Abdominal   Peds  Hematology   Anesthesia Other Findings   Reproductive/Obstetrics                             Anesthesia Physical Anesthesia Plan  ASA: IV and emergent  Anesthesia Plan: General   Post-op Pain Management:    Induction: Intravenous  PONV Risk Score and Plan: Ondansetron and Dexamethasone  Airway Management Planned: Oral ETT  Additional Equipment: Arterial line, CVP and Ultrasound Guidance Line Placement  Intra-op Plan:   Post-operative Plan: Extubation in OR  Informed Consent: I have reviewed the patients History and Physical, chart, labs and discussed the procedure including the risks, benefits and alternatives for the proposed anesthesia with the patient or authorized representative who has indicated his/her understanding and acceptance.     Dental advisory given  Plan Discussed with: CRNA and Anesthesiologist  Anesthesia Plan Comments:         Anesthesia Quick Evaluation

## 2019-10-01 NOTE — ED Notes (Signed)
Unable to get second IV- IV team called.

## 2019-10-01 NOTE — Anesthesia Procedure Notes (Signed)
Arterial Line Insertion Start/End11/07/2019 9:30 PM, 10/01/2019 10:00 PM Performed by: Clovis Cao, CRNA, CRNA  Patient location: OOR procedure area. radial was placed Catheter size: 20 G Hand hygiene performed  and maximum sterile barriers used   Attempts: 1 Procedure performed without using ultrasound guided technique. Ultrasound Notes:anatomy identified, needle tip was noted to be adjacent to the nerve/plexus identified and no ultrasound evidence of intravascular and/or intraneural injection Following insertion, dressing applied. Patient tolerated the procedure well with no immediate complications.
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
## 2019-10-01 NOTE — ED Notes (Signed)
Pt has taken her on c collar off and will not stay in bed keeps ambulating to hallway.
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
## 2019-10-02 ENCOUNTER — Ambulatory Visit (HOSPITAL_COMMUNITY): Payer: Commercial Managed Care - PPO | Admitting: Psychiatry

## 2019-10-02 ENCOUNTER — Encounter (HOSPITAL_COMMUNITY): Payer: Self-pay | Admitting: Vascular Surgery

## 2019-10-02 ENCOUNTER — Inpatient Hospital Stay (HOSPITAL_COMMUNITY): Payer: Commercial Managed Care - PPO

## 2019-10-02 LAB — BASIC METABOLIC PANEL
Anion gap: 8 (ref 5–15)
BUN: 8 mg/dL (ref 6–20)
CO2: 22 mmol/L (ref 22–32)
Calcium: 8.5 mg/dL — ABNORMAL LOW (ref 8.9–10.3)
Chloride: 105 mmol/L (ref 98–111)
Creatinine, Ser: 0.73 mg/dL (ref 0.44–1.00)
GFR calc Af Amer: >60 mL/min (ref >60)
GFR calc non Af Amer: >60 mL/min (ref >60)
Glucose, Bld: 119 mg/dL — ABNORMAL HIGH (ref 70–99)
Potassium: 4.1 mmol/L (ref 3.5–5.1)
Sodium: 135 mmol/L (ref 135–145)

## 2019-10-02 LAB — CBC
HCT: 31.2 % — ABNORMAL LOW (ref 36.0–46.0)
Hemoglobin: 10.5 g/dL — ABNORMAL LOW (ref 12.0–15.0)
MCH: 31.1 pg (ref 26.0–34.0)
MCHC: 33.7 g/dL (ref 30.0–36.0)
MCV: 92.3 fL (ref 80.0–100.0)
Platelets: 225 10*3/uL (ref 150–400)
RBC: 3.38 MIL/uL — ABNORMAL LOW (ref 3.87–5.11)
RDW: 11.8 % (ref 11.5–15.5)
WBC: 7.2 10*3/uL (ref 4.0–10.5)
nRBC: 0 % (ref 0.0–0.2)

## 2019-10-02 LAB — MRSA PCR SCREENING: MRSA by PCR: NEGATIVE

## 2019-10-02 MED ORDER — MORPHINE SULFATE (PF) 2 MG/ML IV SOLN
1.0000 mg | Freq: Three times a day (TID) | INTRAVENOUS | Status: DC | PRN
  Administered 2019-10-02: 15:00:00 1 mg via INTRAVENOUS
  Filled 2019-10-02: qty 1

## 2019-10-02 MED ORDER — GUAIFENESIN-DM 100-10 MG/5ML PO SYRP: 15.0000 mL | ORAL_SOLUTION | ORAL | Status: DC | PRN

## 2019-10-02 MED ORDER — SODIUM CHLORIDE 0.9 % IV SOLN
INTRAVENOUS | Status: DC
  Administered 2019-10-02: 04:00:00 via INTRAVENOUS

## 2019-10-02 MED ORDER — POLYETHYLENE GLYCOL 3350 17 G PO PACK: 17.0000 g | PACK | Freq: Every day | ORAL | Status: DC | PRN

## 2019-10-02 MED ORDER — METHOCARBAMOL 500 MG PO TABS: 500.0000 mg | ORAL_TABLET | Freq: Three times a day (TID) | ORAL | Status: DC | PRN

## 2019-10-02 MED ORDER — DOCUSATE SODIUM 100 MG PO CAPS
100.0000 mg | ORAL_CAPSULE | Freq: Every day | ORAL | Status: DC
  Administered 2019-10-03: 100 mg via ORAL
  Filled 2019-10-02: qty 1

## 2019-10-02 MED ORDER — MORPHINE SULFATE (PF) 2 MG/ML IV SOLN
INTRAVENOUS | Status: AC
  Administered 2019-10-02: 03:00:00 2 mg via INTRAVENOUS
  Filled 2019-10-02: qty 1

## 2019-10-02 MED ORDER — HYDRALAZINE HCL 20 MG/ML IJ SOLN: 5.0000 mg | INTRAMUSCULAR | Status: DC | PRN

## 2019-10-02 MED ORDER — ACETAMINOPHEN 325 MG PO TABS: 325.0000 mg | ORAL_TABLET | ORAL | Status: DC | PRN

## 2019-10-02 MED ORDER — ALUM & MAG HYDROXIDE-SIMETH 200-200-20 MG/5ML PO SUSP: 15.0000 mL | ORAL | Status: DC | PRN

## 2019-10-02 MED ORDER — ONDANSETRON HCL 4 MG/2ML IJ SOLN: 4.0000 mg | Freq: Four times a day (QID) | INTRAMUSCULAR | Status: DC | PRN

## 2019-10-02 MED ORDER — LABETALOL HCL 5 MG/ML IV SOLN: 10.0000 mg | INTRAVENOUS | Status: DC | PRN

## 2019-10-02 MED ORDER — METHOCARBAMOL 500 MG PO TABS
1000.0000 mg | ORAL_TABLET | Freq: Three times a day (TID) | ORAL | Status: DC
  Administered 2019-10-02 – 2019-10-03 (×3): 1000 mg via ORAL
  Filled 2019-10-02 (×3): qty 2

## 2019-10-02 MED ORDER — ONDANSETRON HCL 4 MG/2ML IJ SOLN
INTRAMUSCULAR | Status: AC
  Filled 2019-10-02: qty 2

## 2019-10-02 MED ORDER — ENOXAPARIN SODIUM 30 MG/0.3ML ~~LOC~~ SOLN
30.0000 mg | Freq: Two times a day (BID) | SUBCUTANEOUS | Status: DC
  Administered 2019-10-02: 30 mg via SUBCUTANEOUS
  Filled 2019-10-02 (×2): qty 0.3

## 2019-10-02 MED ORDER — SUGAMMADEX SODIUM 200 MG/2ML IV SOLN
INTRAVENOUS | Status: DC | PRN
  Administered 2019-10-02: 200 mg via INTRAVENOUS

## 2019-10-02 MED ORDER — HEPARIN SODIUM (PORCINE) 1000 UNIT/ML IJ SOLN
INTRAMUSCULAR | Status: DC | PRN
  Administered 2019-10-01: 7000 [IU] via INTRAVENOUS
  Administered 2019-10-02: 5000 [IU] via INTRAVENOUS

## 2019-10-02 MED ORDER — PANTOPRAZOLE SODIUM 40 MG PO TBEC
40.0000 mg | DELAYED_RELEASE_TABLET | Freq: Every day | ORAL | Status: DC
  Administered 2019-10-02: 40 mg via ORAL
  Filled 2019-10-02: qty 1

## 2019-10-02 MED ORDER — CHLORHEXIDINE GLUCONATE CLOTH 2 % EX PADS
6.0000 | MEDICATED_PAD | Freq: Every day | CUTANEOUS | Status: DC
  Administered 2019-10-02: 6 via TOPICAL

## 2019-10-02 MED ORDER — CEFAZOLIN SODIUM-DEXTROSE 2-4 GM/100ML-% IV SOLN
2.0000 g | Freq: Three times a day (TID) | INTRAVENOUS | Status: AC
  Administered 2019-10-02 (×2): 2 g via INTRAVENOUS
  Filled 2019-10-02 (×2): qty 100

## 2019-10-02 MED ORDER — ONDANSETRON HCL 4 MG/2ML IJ SOLN
INTRAMUSCULAR | Status: DC | PRN
  Administered 2019-10-02: 4 mg via INTRAVENOUS

## 2019-10-02 MED ORDER — FLUOXETINE HCL 20 MG PO CAPS
40.0000 mg | ORAL_CAPSULE | Freq: Every morning | ORAL | Status: DC
  Administered 2019-10-02 – 2019-10-03 (×2): 40 mg via ORAL
  Filled 2019-10-02 (×2): qty 2

## 2019-10-02 MED ORDER — IBUPROFEN 600 MG PO TABS
600.0000 mg | ORAL_TABLET | Freq: Three times a day (TID) | ORAL | Status: DC
  Administered 2019-10-02 – 2019-10-03 (×4): 600 mg via ORAL
  Filled 2019-10-02 (×2): qty 1
  Filled 2019-10-02 (×2): qty 3

## 2019-10-02 MED ORDER — GABAPENTIN 300 MG PO CAPS
300.0000 mg | ORAL_CAPSULE | Freq: Two times a day (BID) | ORAL | Status: DC
  Administered 2019-10-02 – 2019-10-03 (×3): 300 mg via ORAL
  Filled 2019-10-02 (×3): qty 1

## 2019-10-02 MED ORDER — PHENOL 1.4 % MT LIQD: 1.0000 | OROMUCOSAL | Status: DC | PRN

## 2019-10-02 MED ORDER — OXYCODONE-ACETAMINOPHEN 5-325 MG PO TABS
1.0000 | ORAL_TABLET | ORAL | Status: DC | PRN
  Administered 2019-10-02 (×2): 2 via ORAL
  Filled 2019-10-02 (×2): qty 2

## 2019-10-02 MED ORDER — ACETAMINOPHEN 650 MG RE SUPP: 325.0000 mg | RECTAL | Status: DC | PRN

## 2019-10-02 MED ORDER — SODIUM CHLORIDE 0.9 % IV SOLN: 500.0000 mL | Freq: Once | INTRAVENOUS | Status: DC | PRN

## 2019-10-02 MED ORDER — INFLUENZA VAC SPLIT QUAD 0.5 ML IM SUSY
0.5000 mL | PREFILLED_SYRINGE | INTRAMUSCULAR | Status: DC
  Filled 2019-10-02: qty 0.5

## 2019-10-02 MED ORDER — OXYCODONE HCL 5 MG PO TABS
5.0000 mg | ORAL_TABLET | ORAL | Status: DC | PRN
  Administered 2019-10-02 – 2019-10-03 (×4): 10 mg via ORAL
  Filled 2019-10-02 (×3): qty 2

## 2019-10-02 MED ORDER — MORPHINE SULFATE (PF) 2 MG/ML IV SOLN
2.0000 mg | INTRAVENOUS | Status: DC | PRN
  Administered 2019-10-02 (×3): 2 mg via INTRAVENOUS
  Filled 2019-10-02 (×3): qty 1

## 2019-10-02 MED ORDER — QUETIAPINE FUMARATE 50 MG PO TABS
100.0000 mg | ORAL_TABLET | Freq: Every day | ORAL | Status: DC
  Administered 2019-10-02: 100 mg via ORAL
  Filled 2019-10-02: qty 2

## 2019-10-02 MED ORDER — METOPROLOL TARTRATE 5 MG/5ML IV SOLN: 2.0000 mg | INTRAVENOUS | Status: DC | PRN

## 2019-10-02 MED ORDER — ACETAMINOPHEN 500 MG PO TABS
1000.0000 mg | ORAL_TABLET | Freq: Four times a day (QID) | ORAL | Status: DC
  Administered 2019-10-02 – 2019-10-03 (×3): 1000 mg via ORAL
  Filled 2019-10-02 (×3): qty 2

## 2019-10-02 MED ORDER — OXYCODONE HCL 5 MG PO TABS
5.0000 mg | ORAL_TABLET | ORAL | Status: DC | PRN
  Filled 2019-10-02: qty 2
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
## 2019-10-02 NOTE — Plan of Care (Signed)
  Problem: Clinical Measurements: Goal: Ability to maintain clinical measurements within normal limits will improve Outcome: Progressing Goal: Postoperative complications will be avoided or minimized Outcome: Progressing   
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
## 2019-10-02 NOTE — Anesthesia Postprocedure Evaluation (Signed)
Anesthesia Post Note  Patient: Lori Hull  Procedure(s) Performed: THORACIC AORTIC ENDOVASCULAR STENT GRAFT (N/A ) Left Femoral Endarterectomy with Patch Angioplasty (Left )     Patient location during evaluation: PACU Anesthesia Type: General Level of consciousness: awake and alert Pain management: pain level controlled Vital Signs Assessment: post-procedure vital signs reviewed and stable Respiratory status: spontaneous breathing, nonlabored ventilation, respiratory function stable and patient connected to nasal cannula oxygen Cardiovascular status: blood pressure returned to baseline and stable Postop Assessment: no apparent nausea or vomiting Anesthetic complications: no    Last Vitals:  Vitals:   10/02/19 0155 10/02/19 0200  BP: 108/60 108/60  Pulse: 80 72  Resp: (!) (P) 25 (!) 25  Temp: 36.9 C   SpO2: (P) 97% 99%    Last Pain:  Vitals:   10/02/19 0155  TempSrc:   PainSc: (P) 0-No pain                 Yuritza Paulhus COKER

## 2019-10-02 NOTE — Transfer of Care (Signed)
Immediate Anesthesia Transfer of Care Note  Patient: Lori Hull  Procedure(s) Performed: THORACIC AORTIC ENDOVASCULAR STENT GRAFT (N/A ) Left Femoral Endarterectomy with Patch Angioplasty (Left )  Patient Location: PACU  Anesthesia Type:General  Level of Consciousness: drowsy  Airway & Oxygen Therapy: Patient Spontanous Breathing and Patient connected to face mask oxygen  Post-op Assessment: Report given to RN and Post -op Vital signs reviewed and stable  Post vital signs: Reviewed and stable  Last Vitals:  Vitals Value Taken Time  BP 108/60 10/02/19 0158  Temp    Pulse 77 10/02/19 0201  Resp 25 10/02/19 0201  SpO2 99 % 10/02/19 0201  Vitals shown include unvalidated device data.  Last Pain:  Vitals:   10/01/19 1910  TempSrc:   PainSc: 5          Complications: No apparent anesthesia complications
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
## 2019-10-02 NOTE — Progress Notes (Signed)
Patient transfered to 6N01 from 4N. Alert and oriented x 4.Denies pain at this time. VS within normal limits. Abrasions noted  bilateral arms and legs. Incision to left groin clean,dry and intact. Oriented to room and remote.

## 2019-10-02 NOTE — Progress Notes (Signed)
Wasted 1 mg of morphine in the sink. Witnessed by Candy Sledge, RN

## 2019-10-03 LAB — BASIC METABOLIC PANEL
Anion gap: 8 (ref 5–15)
BUN: 7 mg/dL (ref 6–20)
CO2: 22 mmol/L (ref 22–32)
Calcium: 8.2 mg/dL — ABNORMAL LOW (ref 8.9–10.3)
Chloride: 110 mmol/L (ref 98–111)
Creatinine, Ser: 0.65 mg/dL (ref 0.44–1.00)
GFR calc Af Amer: >60 mL/min (ref >60)
GFR calc non Af Amer: >60 mL/min (ref >60)
Glucose, Bld: 93 mg/dL (ref 70–99)
Potassium: 3.7 mmol/L (ref 3.5–5.1)
Sodium: 140 mmol/L (ref 135–145)

## 2019-10-03 LAB — CBC
HCT: 31.5 % — ABNORMAL LOW (ref 36.0–46.0)
Hemoglobin: 10.4 g/dL — ABNORMAL LOW (ref 12.0–15.0)
MCH: 31.2 pg (ref 26.0–34.0)
MCHC: 33 g/dL (ref 30.0–36.0)
MCV: 94.6 fL (ref 80.0–100.0)
Platelets: 189 10*3/uL (ref 150–400)
RBC: 3.33 MIL/uL — ABNORMAL LOW (ref 3.87–5.11)
RDW: 11.9 % (ref 11.5–15.5)
WBC: 5.1 10*3/uL (ref 4.0–10.5)
nRBC: 0 % (ref 0.0–0.2)

## 2019-10-03 MED ORDER — ACETAMINOPHEN 500 MG PO TABS: 1000.0000 mg | ORAL_TABLET | Freq: Four times a day (QID) | ORAL | 0 refills | Status: DC | PRN

## 2019-10-03 MED ORDER — OXYCODONE HCL 5 MG PO TABS: 5.0000 mg | ORAL_TABLET | ORAL | 0 refills | Status: DC | PRN

## 2019-10-03 MED ORDER — IBUPROFEN 600 MG PO TABS: 600.0000 mg | ORAL_TABLET | Freq: Three times a day (TID) | ORAL | 0 refills | Status: DC | PRN

## 2019-10-03 NOTE — Progress Notes (Signed)
Lori Hull to be D/C'd  per MD order. Discussed with the patient and all questions fully answered.  VSS, Skin clean, dry and intact without evidence of skin break down, no evidence of skin tears noted.  IV catheter discontinued intact. Site without signs and symptoms of complications. Dressing and pressure applied.  An After Visit Summary was printed and given to the patient. Patient received prescription.  D/c education completed with patient/family including follow up instructions, medication list, d/c activities limitations if indicated, with other d/c instructions as indicated by MD - patient able to verbalize understanding, all questions fully answered.   Patient instructed to return to ED, call 911, or call MD for any changes in condition.   Patient to be escorted via [place], and D/C home via private auto.

## 2019-10-03 NOTE — Progress Notes (Signed)
OT Cancellation Note  Patient Details Name: Lori Hull MRN: [id number] DOB: [birth year]   Cancelled Treatment:    Reason Eval/Treat Not Completed: Other (comment)(Pt being discharged at this time. OTR screening pt.)  OTR provided education for pt regarding proper positioning for bed mobility, movement and LB ADL with figure 4 technique.   Pt advised little to no pushing/pulling movement in BUEs. Pt plans to perform log roll at home for bed mobility at this time. Pt could benefit from continued OT skilled services for ADL.  Ebony Hail Harold Hedge) Marsa Aris OTR/L Acute Rehabilitation Services Pager: [phone] Office: [place] 10/03/2019, 10:40 AM

## 2019-10-03 NOTE — Plan of Care (Signed)
  Problem: Education: Goal: Required Educational Video(s) Outcome: Completed/Met   Problem: Clinical Measurements: Goal: Ability to maintain clinical measurements within normal limits will improve Outcome: Completed/Met Goal: Postoperative complications will be avoided or minimized Outcome: Completed/Met   Problem: Skin Integrity: Goal: Demonstration of wound healing without infection will improve Outcome: Completed/Met   Problem: Education: Goal: Knowledge of General Education information will improve Description: Including pain rating scale, medication(s)/side effects and non-pharmacologic comfort measures Outcome: Completed/Met   Problem: Health Behavior/Discharge Planning: Goal: Ability to manage health-related needs will improve Outcome: Completed/Met   Problem: Clinical Measurements: Goal: Ability to maintain clinical measurements within normal limits will improve Outcome: Completed/Met Goal: Will remain free from infection Outcome: Completed/Met Goal: Diagnostic test results will improve Outcome: Completed/Met Goal: Respiratory complications will improve Outcome: Completed/Met Goal: Cardiovascular complication will be avoided Outcome: Completed/Met   Problem: Activity: Goal: Risk for activity intolerance will decrease Outcome: Completed/Met   Problem: Nutrition: Goal: Adequate nutrition will be maintained Outcome: Completed/Met   Problem: Coping: Goal: Level of anxiety will decrease Outcome: Completed/Met   Problem: Elimination: Goal: Will not experience complications related to bowel motility Outcome: Completed/Met Goal: Will not experience complications related to urinary retention Outcome: Completed/Met   Problem: Pain Managment: Goal: General experience of comfort will improve Outcome: Completed/Met   Problem: Safety: Goal: Ability to remain free from injury will improve Outcome: Completed/Met   Problem: Skin Integrity: Goal: Risk for impaired  skin integrity will decrease Outcome: Completed/Met   
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
## 2019-10-03 NOTE — Discharge Instructions (Signed)
No lifting over 15lbs for 2-3 weeks to avoid hernias through your incision  Angiogram, Care After This sheet gives you information about how to care for yourself after your procedure. Your doctor may also give you more specific instructions. If you have problems or questions, contact your doctor. Follow these instructions at home: Insertion site care  Follow instructions from your doctor about how to take care of your long, thin tube (catheter) insertion area. Make sure you: ? Wash your hands with soap and water before you change your bandage (dressing). If you cannot use soap and water, use hand sanitizer. ? Change your bandage as told by your doctor. ? Leave stitches (sutures), skin glue, or skin tape (adhesive) strips in place. They may need to stay in place for 2 weeks or longer. If tape strips get loose and curl up, you may trim the loose edges. Do not remove tape strips completely unless your doctor says it is okay.  Do not take baths, swim, or use a hot tub until your doctor says it is okay.  You may shower 24-48 hours after the procedure or as told by your doctor. ? Gently wash the area with plain soap and water. ? Pat the area dry with a clean towel. ? Do not rub the area. This may cause bleeding.  Do not apply powder or lotion to the area. Keep the area clean and dry.  Check your insertion area every day for signs of infection. Check for: ? More redness, swelling, or pain. ? Fluid or blood. ? Warmth. ? Pus or a bad smell. Activity  Rest as told by your doctor, usually for 1-2 days.  Do not lift anything that is heavier than 10 lbs. (4.5 kg) or as told by your doctor.  Do not drive for 24 hours if you were given a medicine to help you relax (sedative).  Do not drive or use heavy machinery while taking prescription pain medicine. General instructions   Go back to your normal activities as told by your doctor, usually in about a week. Ask your doctor what activities are  safe for you.  If the insertion area starts to bleed, lie flat and put pressure on the area. If the bleeding does not stop, get help right away. This is an emergency.  Drink enough fluid to keep your pee (urine) clear or pale yellow.  Take over-the-counter and prescription medicines only as told by your doctor.  Keep all follow-up visits as told by your doctor. This is important. Contact a doctor if:  You have a fever.  You have chills.  You have more redness, swelling, or pain around your insertion area.  You have fluid or blood coming from your insertion area.  The insertion area feels warm to the touch.  You have pus or a bad smell coming from your insertion area.  You have more bruising around the insertion area.  Blood collects in the tissue around the insertion area (hematoma) that may be painful to the touch. Get help right away if:  You have a lot of pain in the insertion area.  The insertion area swells very fast.  The insertion area is bleeding, and the bleeding does not stop after holding steady pressure on the area.  The area near or just beyond the insertion area becomes pale, cool, tingly, or numb. These symptoms may be an emergency. Do not wait to see if the symptoms will go away. Get medical help right away. Call your  local emergency services (911 in the [place] Do not drive yourself to the hospital. Summary  After the procedure, it is common to have bruising and tenderness at the long, thin tube insertion area.  After the procedure, it is important to rest and drink plenty of fluids.  Do not take baths, swim, or use a hot tub until your doctor says it is okay to do so. You may shower 24-48 hours after the procedure or as told by your doctor.  If the insertion area starts to bleed, lie flat and put pressure on the area. If the bleeding does not stop, get help right away. This is an emergency. This information is not intended to replace advice given to you  by your health care provider. Make sure you discuss any questions you have with your health care provider. Document Released: 02/04/2009 Document Revised: 10/21/2017 Document Reviewed: 11/02/2016 Elsevier Patient Education  2020 [employer].

## 2019-10-04 LAB — TYPE AND SCREEN
ABO/RH(D): A POS
Antibody Screen: NEGATIVE
Unit division: 0
Unit division: 0
Unit division: 0
Unit division: 0

## 2019-10-04 LAB — BPAM RBC
Blood Product Expiration Date: 202011152359
Blood Product Expiration Date: 202012072359
Blood Product Expiration Date: 202012082359
Blood Product Expiration Date: 202012082359
ISSUE DATE / TIME: 202011092245
ISSUE DATE / TIME: 202011092245
ISSUE DATE / TIME: 202011092245
ISSUE DATE / TIME: 202011092245
Unit Type and Rh: 6200
Unit Type and Rh: 6200
Unit Type and Rh: 6200
Unit Type and Rh: 6200

## 2019-10-08 ENCOUNTER — Telehealth: Payer: Self-pay | Admitting: *Deleted

## 2019-10-08 NOTE — Telephone Encounter (Signed)
Called patient. No answer. LVM letting patient know Dr. Volanda Napoleon will not refill pain medication.

## 2019-10-08 NOTE — Telephone Encounter (Signed)
No
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
## 2019-10-29 ENCOUNTER — Other Ambulatory Visit: Payer: Self-pay

## 2019-10-29 DIAGNOSIS — I71019 Dissection of thoracic aorta, unspecified: Secondary | ICD-10-CM

## 2019-10-29 DIAGNOSIS — I7101 Dissection of thoracic aorta: Secondary | ICD-10-CM

## 2019-11-01 ENCOUNTER — Ambulatory Visit (HOSPITAL_COMMUNITY)
Admission: RE | Admit: 2019-11-01 | Discharge: 2019-11-01 | Disposition: A | Discharge status: Home / Self Care | Payer: Commercial Managed Care - PPO | Source: Ambulatory Visit | Attending: Vascular Surgery | Admitting: Vascular Surgery

## 2019-11-01 ENCOUNTER — Encounter: Payer: Commercial Managed Care - PPO | Admitting: Vascular Surgery

## 2019-11-01 ENCOUNTER — Encounter (HOSPITAL_COMMUNITY): Payer: Self-pay

## 2019-11-01 ENCOUNTER — Other Ambulatory Visit: Payer: Self-pay

## 2019-11-01 DIAGNOSIS — I71019 Dissection of thoracic aorta, unspecified: Secondary | ICD-10-CM

## 2019-11-01 DIAGNOSIS — I7101 Dissection of thoracic aorta: Secondary | ICD-10-CM

## 2019-11-12 ENCOUNTER — Ambulatory Visit (INDEPENDENT_AMBULATORY_CARE_PROVIDER_SITE_OTHER): Payer: Commercial Managed Care - PPO | Admitting: Psychiatry

## 2019-11-12 ENCOUNTER — Other Ambulatory Visit: Payer: Self-pay

## 2019-11-12 DIAGNOSIS — F1210 Cannabis abuse, uncomplicated: Secondary | ICD-10-CM

## 2019-11-12 DIAGNOSIS — R519 Headache, unspecified: Secondary | ICD-10-CM

## 2019-11-12 DIAGNOSIS — F1911 Other psychoactive substance abuse, in remission: Secondary | ICD-10-CM | POA: Diagnosis not present

## 2019-11-12 DIAGNOSIS — F3181 Bipolar II disorder: Secondary | ICD-10-CM | POA: Diagnosis not present

## 2019-11-12 DIAGNOSIS — F1121 Opioid dependence, in remission: Secondary | ICD-10-CM

## 2019-11-12 DIAGNOSIS — F411 Generalized anxiety disorder: Secondary | ICD-10-CM | POA: Diagnosis not present

## 2019-11-12 DIAGNOSIS — F1221 Cannabis dependence, in remission: Secondary | ICD-10-CM

## 2019-11-12 MED ORDER — GABAPENTIN 300 MG PO CAPS: 300.0000 mg | ORAL_CAPSULE | Freq: Two times a day (BID) | ORAL | 1 refills | Status: DC

## 2019-11-12 MED ORDER — QUETIAPINE FUMARATE ER 300 MG PO TB24: 300.0000 mg | ORAL_TABLET | Freq: Every day | ORAL | 2 refills | Status: DC

## 2019-11-12 MED ORDER — FLUOXETINE HCL 40 MG PO CAPS: 40.0000 mg | ORAL_CAPSULE | Freq: Every morning | ORAL | 2 refills | Status: DC

## 2019-11-12 NOTE — Progress Notes (Signed)
BH MD/PA/NP OP Progress Note  11/12/2019 2:44 PM Idalia Azia Toutant  MRN:  [id number] Interview was conducted by phone and I verified that I was speaking with the correct person using two identifiers. I discussed the limitations of evaluation and management by telemedicine and  the availability of in person appointments. Patient expressed understanding and agreed to proceed.  Chief Complaint: "I am doing well now".  HPI: 18 yo single female with long hx of polysubstance abuse who is now in sustained remission from alcohol/cannabis, alprazolam and early remission from opioids (heroin). She has a hx of anxiety (genalized, panic, social type), depression with two OD attempts very likely bipolar 2 disorder (depression). Several med trials not very meaningfull as she was abusing drugs at the same time. She has been to residential substance abuse rehab in [place] and has been sober for 3 months after completion of the program. Her medications have been adjusted while in rehab: she is on 40 mg of fluoxetine, 300 mg of Seroquel XR, 300 mg bid of gabapentin and propranolol 20 mg tid prn anxiety. Her mood is stable, sleep and appetite good. No SI, no psychosis.   Visit Diagnosis:    ICD-10-CM   1. Bipolar 2 disorder (HCC)  F31.81   2. Persistent headaches  R51.9 gabapentin (NEURONTIN) 300 MG capsule  3. Opioid use disorder, moderate, in early remission, dependence ([place])  F11.21   4. Cannabis use disorder, moderate, in sustained remission (HCC)  F12.21   5. GAD (generalized anxiety disorder)  F41.1   6. Marijuana abuse  F12.10     Past Psychiatric History: Please see intake H&P.  Past Medical History:  Past Medical History:  Diagnosis Date  . Allergy   . Anxiety   . Depression   . Heart murmur   . 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 Seizures ([place])     Past Surgical History:  Procedure Laterality Date  . ENDARTERECTOMY FEMORAL Left 10/01/2019   Procedure: Left Femoral Endarterectomy with Patch  Angioplasty;  Surgeon: Elam Dutch, MD;  Location: [place];  Service: Vascular;  Laterality: Left;  . THORACIC AORTIC ENDOVASCULAR STENT GRAFT N/A 10/01/2019   Procedure: THORACIC AORTIC ENDOVASCULAR STENT GRAFT;  Surgeon: Elam Dutch, MD;  Location: [place];  Service: Vascular;  Laterality: N/A;    Family Psychiatric History: Reviewed.  Family History:  Family History  Problem Relation Age of Onset  . Anxiety disorder Mother   . Cancer Mother        breast  . Anxiety disorder Brother   . Drug abuse Brother   . Alcohol abuse Paternal Grandfather   . Anxiety disorder Cousin   . Heart attack Father     Social History:  Social History   Socioeconomic History  . Marital status: Single    Spouse name: Not on file  . Number of children: Not on file  . Years of education: Not on file  . Highest education level: Not on file  Occupational History  . Not on file  Tobacco Use  . Smoking status: Current Some Day Smoker    Packs/day: 0.50    Years: 0.60    Pack years: 0.30    Types: Cigarettes  . Smokeless tobacco: Never Used  Substance and Sexual Activity  . Alcohol use: Not Currently  . 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 Sexual activity: Yes    Birth  control/protection: Implant  Other Topics Concern  . Not on file  Social History Narrative  . Not on file   Social Determinants of Health   Financial Resource Strain:   . Difficulty of Paying Living Expenses: Not on file  Food Insecurity:   . Worried About Programme researcher, broadcasting/film/videounning Out of Food in the Last Year: Not on file  . Ran Out of Food in the Last Year: Not on file  Transportation Needs:   . Lack of Transportation (Medical): Not on file  . Lack of Transportation (Non-Medical): Not on file  Physical Activity:   . Days of Exercise per Week: Not on file  . Minutes of Exercise per Session: Not on file  Stress: Stress Concern Present  . Feeling  of Stress : Very much  Social Connections:   . Frequency of Communication with Friends and Family: Not on file  . Frequency of Social Gatherings with Friends and Family: Not on file  . Attends Religious Services: Not on file  . Active Member of Clubs or Organizations: Not on file  . Attends BankerClub or Organization Meetings: Not on file  . Marital Status: Not on file    Allergies: No Known Allergies  Metabolic Disorder Labs: No results found for: HGBA1C, MPG No results found for: PROLACTIN No results found for: CHOL, TRIG, HDL, CHOLHDL, VLDL, LDLCALC No results found for: TSH  Therapeutic Level Labs: No results found for: LITHIUM No results found for: VALPROATE No components found for:  CBMZ  Current Medications: Current Outpatient Medications  Medication Sig Dispense Refill  . acetaminophen (TYLENOL) 500 MG tablet Take 2 tablets (1,000 mg total) by mouth every 6 (six) hours as needed. 30 tablet 0  . FLUoxetine (PROZAC) 40 MG capsule Take 1 capsule (40 mg total) by mouth every morning. 30 capsule 2  . gabapentin (NEURONTIN) 300 MG capsule Take 1 capsule (300 mg total) by mouth 2 (two) times daily. 60 capsule 1  . ibuprofen (ADVIL) 600 MG tablet Take 1 tablet (600 mg total) by mouth every 8 (eight) hours as needed. 30 tablet 0  . oxyCODONE (OXY IR/ROXICODONE) 5 MG immediate release tablet Take 1-2 tablets (5-10 mg total) by mouth every 4 (four) hours as needed for moderate pain or severe pain (5mg  for moderate pain, 10mg  for severe pain). 20 tablet 0  . propranolol (INDERAL) 20 MG tablet Take 1 tablet (20 mg total) by mouth 3 (three) times daily. 90 tablet 1  . QUEtiapine (SEROQUEL XR) 300 MG 24 hr tablet Take 1 tablet (300 mg total) by mouth at bedtime. 30 tablet 2  . tiZANidine (ZANAFLEX) 2 MG tablet Take 2 mg by mouth every 6 (six) hours as needed for muscle spasms.      No current facility-administered medications for this visit.     Psychiatric Specialty Exam: Review of  Systems  Psychiatric/Behavioral: The patient is nervous/anxious.   All other systems reviewed and are negative.   There were no vitals taken for this visit.There is no height or weight on file to calculate BMI.  General Appearance: NA  Eye Contact:  NA  Speech:  Clear and Coherent and Normal Rate  Volume:  Normal  Mood:  Mild anxiety  Affect:  NA  Thought Process:  Goal Directed and Linear  Orientation:  Full (Time, Place, and Person)  Thought Content: Logical   Suicidal Thoughts:  No  Homicidal Thoughts:  No  Memory:  Immediate;   Good Recent;   Good Remote;   Good  Judgement:  Fair  Insight:  Fair  Psychomotor Activity:  NA  Concentration:  Concentration: Good  Recall:  Fair  Fund of Knowledge: Good  Language: Good  Akathisia:  Negative  Handed:  Right  AIMS (if indicated): not done  Assets:  Communication Skills Desire for Improvement Financial Resources/Insurance Housing Resilience Social Support Talents/Skills  ADL's:  Intact  Cognition: WNL  Sleep:  Good   Screenings: GAD-7     Office Visit from 01/29/2019 in [facility] from 12/13/2017 in [place] [facility]  Total GAD-7 Score  16  13    PHQ2-9     Office Visit from 01/29/2019 in [facility] from 12/13/2017 in [place] [facility]  PHQ-2 Total Score  6  6  PHQ-9 Total Score  25  14       Assessment and Plan: 18 yo single female with long hx of polysubstance abuse who is now in sustained remission from alcohol/cannabis, alprazolam and early remission from opioids (heroin). She has a hx of anxiety (genalized, panic, social type), depression with two OD attempts very likely bipolar 2 disorder (depression). Several med trials not very meaningfull as she was abusing drugs at the same time. She has been to residential substance abuse rehab  in [place] and has been sober for 3 months after completion of the program. Her medications have been adjusted while in rehab: she is on 40 mg of fluoxetine, 300 mg of Seroquel XR, 300 mg bid of gabapentin and propranolol 20 mg tid prn anxiety. Her mood is stable, sleep and appetite good. No SI, no psychosis.   Dx: Bipolar 2 disorder; GAD; Polysubstance abuse in early remission  Plan: Continue fluoxetine 40 mg daily, gabapentin 300 mg bid, Seroquel XR 300 mg at HS. Next med management visit in two months. The plan was discussed with patient who had an opportunity to ask questions and these were all answered. I spend 25 minutes in phone consultation with the patient.     Magdalene Patricia, MD 11/12/2019, 2:44 PM

## 2019-12-14 ENCOUNTER — Ambulatory Visit (HOSPITAL_COMMUNITY)
Admission: RE | Admit: 2019-12-14 | Discharge: 2019-12-14 | Disposition: A | Discharge status: Home / Self Care | Payer: Commercial Managed Care - PPO | Source: Ambulatory Visit | Attending: Vascular Surgery | Admitting: Vascular Surgery

## 2019-12-14 ENCOUNTER — Other Ambulatory Visit: Payer: Self-pay

## 2019-12-14 DIAGNOSIS — I7101 Dissection of thoracic aorta: Secondary | ICD-10-CM | POA: Diagnosis not present

## 2019-12-14 MED ORDER — IOHEXOL 350 MG/ML SOLN
100.0000 mL | Freq: Once | INTRAVENOUS | Status: AC | PRN
  Administered 2019-12-14: 16:00:00 100 mL via INTRAVENOUS

## 2019-12-19 ENCOUNTER — Telehealth (HOSPITAL_COMMUNITY): Payer: Self-pay

## 2019-12-19 NOTE — Telephone Encounter (Signed)

## 2019-12-20 ENCOUNTER — Ambulatory Visit (INDEPENDENT_AMBULATORY_CARE_PROVIDER_SITE_OTHER): Payer: Self-pay | Admitting: Vascular Surgery

## 2019-12-20 ENCOUNTER — Other Ambulatory Visit: Payer: Self-pay

## 2019-12-20 ENCOUNTER — Encounter: Payer: Self-pay | Admitting: Vascular Surgery

## 2019-12-20 VITALS — BP 123/68 | HR 77 | Temp 98.5°F | Resp 20 | Ht 68.0 in | Wt 163.0 lb

## 2019-12-20 DIAGNOSIS — S2509XD Other specified injury of thoracic aorta, subsequent encounter: Secondary | ICD-10-CM

## 2019-12-20 NOTE — Progress Notes (Signed)
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  She also required left femoral endarterectomy.  She reports no numbness or tingling in her feet.  She had some drainage from her left groin initially but this has now resolved.  Physical exam:  Vitals:   12/20/19 0810  BP: 123/68  Pulse: 77  Resp: 20  Temp: 98.5 F (36.9 C)  SpO2: 97%  Weight: 163 lb (73.9 kg)  Height: 5\' 8"  (1.727 m)    Extremities: 2+ femoral dorsalis pedis pulses bilaterally  Data: Patient had a CT angiogram of the chest December 14, 2019.  This showed the stent in good position with no encroachment on the great vessels.  It was widely patent.  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  Assessment: Patent thoracic stent graft with no complicating features.  Well-healed left groin incision with femoral endarterectomy  Plan: Patient was counseled against smoking  She will follow-up in 1 year for a CT angio of the chest.  If that has no significant changes we will probably space her follow-up out significantly to avoid long-term radiation in this young patient.  December 16, 2019, MD [facility] Office: [phone]

## 2020-01-14 ENCOUNTER — Ambulatory Visit (INDEPENDENT_AMBULATORY_CARE_PROVIDER_SITE_OTHER): Payer: Commercial Managed Care - PPO | Admitting: Psychiatry

## 2020-01-14 ENCOUNTER — Other Ambulatory Visit: Payer: Self-pay

## 2020-01-14 DIAGNOSIS — F411 Generalized anxiety disorder: Secondary | ICD-10-CM

## 2020-01-14 DIAGNOSIS — F410 Panic disorder [episodic paroxysmal anxiety] without agoraphobia: Secondary | ICD-10-CM

## 2020-01-14 DIAGNOSIS — F1221 Cannabis dependence, in remission: Secondary | ICD-10-CM | POA: Diagnosis not present

## 2020-01-14 DIAGNOSIS — F3181 Bipolar II disorder: Secondary | ICD-10-CM

## 2020-01-14 DIAGNOSIS — R519 Headache, unspecified: Secondary | ICD-10-CM

## 2020-01-14 DIAGNOSIS — F1121 Opioid dependence, in remission: Secondary | ICD-10-CM

## 2020-01-14 DIAGNOSIS — F4010 Social phobia, unspecified: Secondary | ICD-10-CM

## 2020-01-14 MED ORDER — QUETIAPINE FUMARATE ER 300 MG PO TB24: 300.0000 mg | ORAL_TABLET | Freq: Every day | ORAL | 1 refills | Status: DC

## 2020-01-14 MED ORDER — GABAPENTIN 300 MG PO CAPS: 300.0000 mg | ORAL_CAPSULE | Freq: Two times a day (BID) | ORAL | 1 refills | Status: DC

## 2020-01-14 MED ORDER — FLUOXETINE HCL 40 MG PO CAPS: 40.0000 mg | ORAL_CAPSULE | Freq: Every morning | ORAL | 1 refills | Status: DC

## 2020-01-14 NOTE — Progress Notes (Signed)
BH MD/PA/NP OP Progress Note  01/14/2020 1:12 PM Lori Hull  MRN:  [id number] Interview was conducted by phone and I verified that I was speaking with the correct person using two identifiers. I discussed the limitations of evaluation and management by telemedicine and  the availability of in person appointments. Patient expressed understanding and agreed to proceed.  Chief Complaint: "I am doing fine".  HPI: 19 yo single female with long hx of polysubstance abusewho is now in sustained remission from alcohol/cannabis, alprazolam and early remission from opioids (heroin). She has a hx of anxiety (genalized, panic, social type), depression with two OD attemptsvery likelybipolar 2 disorder (depression).Several med trials not very meaningfull as she was abusing drugs at the same time. She has been to residential substance abuse rehab in [facility]) and has been sober for 5 months after completion of the program. Her medications have been adjusted while in rehab: she is on 40 mg of fluoxetine, 300 mg of Seroquel XR, 300 mg bid of gabapentin. She does not have persistent headaches and it is not clear why such diagnosis is associated with gabapentin use. She no longer takes propranolol 20 mg tid prn anxiety. Her mood is stable, sleep and appetite good. No SI, no psychosis. She has no car and does not drive.  Visit Diagnosis:    ICD-10-CM   1. Bipolar 2 disorder (HCC)  F31.81   2. Persistent headaches  R51.9 gabapentin (NEURONTIN) 300 MG capsule  3. Cannabis use disorder, moderate, in sustained remission (HCC)  F12.21   4. GAD (generalized anxiety disorder)  F41.1   5. Opioid use disorder, moderate, in early remission, dependence ([place])  F11.21   6. Panic disorder  F41.0   7. Social anxiety disorder  F40.10     Past Psychiatric History: Please see intake H&P.  Past Medical History:  Past Medical History:  Diagnosis Date  . Allergy   . Anxiety   . Depression   . Heart  murmur   . 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 Seizures ([place])     Past Surgical History:  Procedure Laterality Date  . ENDARTERECTOMY FEMORAL Left 10/01/2019   Procedure: Left Femoral Endarterectomy with Patch Angioplasty;  Surgeon: Elam Dutch, MD;  Location: [place];  Service: Vascular;  Laterality: Left;  . THORACIC AORTIC ENDOVASCULAR STENT GRAFT N/A 10/01/2019   Procedure: THORACIC AORTIC ENDOVASCULAR STENT GRAFT;  Surgeon: Elam Dutch, MD;  Location: [place];  Service: Vascular;  Laterality: N/A;    Family Psychiatric History: Reviewed.  Family History:  Family History  Problem Relation Age of Onset  . Anxiety disorder Mother   . Cancer Mother        breast  . Anxiety disorder Brother   . Drug abuse Brother   . Alcohol abuse Paternal Grandfather   . Anxiety disorder Cousin   . Heart attack Father     Social History:  Social History   Socioeconomic History  . Marital status: Single    Spouse name: Not on file  . Number of children: Not on file  . Years of education: Not on file  . Highest education level: Not on file  Occupational History  . Not on file  Tobacco Use  . Smoking status: Current Some Day Smoker    Packs/day: 0.50    Years: 0.60    Pack years: 0.30    Types: Cigarettes  . Smokeless tobacco: Never Used  Substance and Sexual Activity  . Alcohol use:  Not Currently  . 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 Sexual activity: Yes    Birth control/protection: Implant  Other Topics Concern  . Not on file  Social History Narrative  . Not on file   Social Determinants of Health   Financial Resource Strain:   . Difficulty of Paying Living Expenses: Not on file  Food Insecurity:   . Worried About Programme researcher, broadcasting/film/video in the Last Year: Not on file  . Ran Out of Food in the Last Year: Not on file  Transportation Needs:   . Lack of Transportation (Medical): Not on file  .  Lack of Transportation (Non-Medical): Not on file  Physical Activity:   . Days of Exercise per Week: Not on file  . Minutes of Exercise per Session: Not on file  Stress: Stress Concern Present  . Feeling of Stress : Very much  Social Connections:   . Frequency of Communication with Friends and Family: Not on file  . Frequency of Social Gatherings with Friends and Family: Not on file  . Attends Religious Services: Not on file  . Active Member of Clubs or Organizations: Not on file  . Attends Banker Meetings: Not on file  . Marital Status: Not on file    Allergies: No Known Allergies  Metabolic Disorder Labs: No results found for: HGBA1C, MPG No results found for: PROLACTIN No results found for: CHOL, TRIG, HDL, CHOLHDL, VLDL, LDLCALC No results found for: TSH  Therapeutic Level Labs: No results found for: LITHIUM No results found for: VALPROATE No components found for:  CBMZ  Current Medications: Current Outpatient Medications  Medication Sig Dispense Refill  . acetaminophen (TYLENOL) 500 MG tablet Take 2 tablets (1,000 mg total) by mouth every 6 (six) hours as needed. 30 tablet 0  . [START ON 02/08/2020] FLUoxetine (PROZAC) 40 MG capsule Take 1 capsule (40 mg total) by mouth every morning. 90 capsule 1  . gabapentin (NEURONTIN) 300 MG capsule Take 1 capsule (300 mg total) by mouth 2 (two) times daily. 180 capsule 1  . ibuprofen (ADVIL) 600 MG tablet Take 1 tablet (600 mg total) by mouth every 8 (eight) hours as needed. 30 tablet 0  . propranolol (INDERAL) 20 MG tablet Take 1 tablet (20 mg total) by mouth 3 (three) times daily. (Patient not taking: Reported on 12/20/2019) 90 tablet 1  . [START ON 02/08/2020] QUEtiapine (SEROQUEL XR) 300 MG 24 hr tablet Take 1 tablet (300 mg total) by mouth at bedtime. 90 tablet 1  . tiZANidine (ZANAFLEX) 2 MG tablet Take 2 mg by mouth every 6 (six) hours as needed for muscle spasms.      No current facility-administered medications  for this visit.     Psychiatric Specialty Exam: Review of Systems  All other systems reviewed and are negative.   There were no vitals taken for this visit.There is no height or weight on file to calculate BMI.  General Appearance: NA  Eye Contact:  NA  Speech:  Clear and Coherent and Normal Rate  Volume:  Normal  Mood:  Euthymic  Affect:  NA  Thought Process:  Goal Directed and Linear  Orientation:  Full (Time, Place, and Person)  Thought Content: Logical   Suicidal Thoughts:  No  Homicidal Thoughts:  No  Memory:  Immediate;   Good Recent;   Good Remote;   Good  Judgement:  Good  Insight:  Good  Psychomotor Activity:  NA  Concentration:  Concentration: Good  Recall:  Good  Fund of Knowledge: Good  Language: Good  Akathisia:  Negative  Handed:  Right  AIMS (if indicated): not done  Assets:  Architect Housing Physical Health Social Support  ADL's:  Intact  Cognition: WNL  Sleep:  Good   Screenings: GAD-7     Office Visit from 01/29/2019 in [facility] from 12/13/2017 in [place] [facility]  Total GAD-7 Score  16  13    PHQ2-9     Office Visit from 01/29/2019 in [facility] [employer] from 12/13/2017 in [place] [facility]  PHQ-2 Total Score  6  6  PHQ-9 Total Score  25  14       Assessment and Plan: 19 yo single female with long hx of polysubstance abusewho is now in sustained remission from alcohol/cannabis, alprazolam and early remission from opioids (heroin). She has a hx of anxiety (genalized, panic, social type), depression with two OD attemptsvery likelybipolar 2 disorder (depression).Several med trials not very meaningfull as she was abusing drugs at the same time. She has been to residential substance abuse rehab in [facility] and has  been sober for 5 months after completion of the program. Her medications have been adjusted while in rehab: she is on 40 mg of fluoxetine, 300 mg of Seroquel XR, 300 mg bid of gabapentin and propranolol 20 mg tid prn anxiety. Her mood is stable, sleep and appetite good. No SI, no psychosis. She has no car and does not drive.  Dx: Bipolar 2 disorder; GAD; Polysubstance abuse in early remission  Plan: Continue fluoxetine 40 mg daily, gabapentin 300 mg bid, Seroquel XR 300 mg at HS. Next med management visit in three months. The plan was discussed with patient who had an opportunity to ask questions and these were all answered. I spend 20 minutes in phone consultation with the patient.     Magdalene Patricia, MD 01/14/2020, 1:12 PM

## 2020-01-16 ENCOUNTER — Other Ambulatory Visit (HOSPITAL_COMMUNITY): Payer: Self-pay | Admitting: *Deleted

## 2020-01-16 DIAGNOSIS — R519 Headache, unspecified: Secondary | ICD-10-CM

## 2020-01-16 DIAGNOSIS — F411 Generalized anxiety disorder: Secondary | ICD-10-CM

## 2020-01-16 DIAGNOSIS — F3181 Bipolar II disorder: Secondary | ICD-10-CM

## 2020-01-16 MED ORDER — GABAPENTIN 300 MG PO CAPS: 300.0000 mg | ORAL_CAPSULE | Freq: Two times a day (BID) | ORAL | 1 refills | Status: DC

## 2020-01-16 MED ORDER — FLUOXETINE HCL 40 MG PO CAPS: 40.0000 mg | ORAL_CAPSULE | Freq: Every morning | ORAL | 1 refills | Status: DC

## 2020-04-29 ENCOUNTER — Other Ambulatory Visit: Payer: Self-pay

## 2020-04-29 ENCOUNTER — Telehealth (HOSPITAL_COMMUNITY): Payer: Commercial Managed Care - PPO | Admitting: Psychiatry

## 2020-05-29 ENCOUNTER — Telehealth (INDEPENDENT_AMBULATORY_CARE_PROVIDER_SITE_OTHER): Payer: Commercial Managed Care - PPO | Admitting: Psychiatry

## 2020-05-29 ENCOUNTER — Other Ambulatory Visit: Payer: Self-pay

## 2020-05-29 DIAGNOSIS — F411 Generalized anxiety disorder: Secondary | ICD-10-CM | POA: Diagnosis not present

## 2020-05-29 DIAGNOSIS — F1121 Opioid dependence, in remission: Secondary | ICD-10-CM | POA: Diagnosis not present

## 2020-05-29 DIAGNOSIS — F3181 Bipolar II disorder: Secondary | ICD-10-CM

## 2020-05-29 MED ORDER — FLUOXETINE HCL 40 MG PO CAPS: 40.0000 mg | ORAL_CAPSULE | Freq: Every morning | ORAL | 1 refills | Status: DC

## 2020-05-29 MED ORDER — QUETIAPINE FUMARATE ER 300 MG PO TB24: 300.0000 mg | ORAL_TABLET | Freq: Every day | ORAL | 1 refills | Status: DC

## 2020-05-29 NOTE — Progress Notes (Signed)
BH MD/PA/NP OP Progress Note  05/29/2020 10:18 AM Lori Hull  MRN:  [id number] Interview was conducted by phone and I verified that I was speaking with the correct person using two identifiers. I discussed the limitations of evaluation and management by telemedicine and  the availability of in person appointments. Patient expressed understanding and agreed to proceed. Patient location - home; physician - home office.  Chief Complaint: "I am doing well".  HPI: 19yo single female with long hx ofpolysubstance abusewho is now in sustained remission from alcohol/cannabis, alprazolam and early remission from opioids (heroin). She has a hx of anxiety (genalized, panic, social type), depression with two OD attemptsvery likelybipolar 2 disorder (depression).Several med trials not very meaningfull as she was abusing drugs at the same time. Shehas been to residential substance abuse rehab in [place]  [facility]) and has been sober for 5 months after completion of the program. Her medications have been adjusted while in rehab: she is on 40 mg of fluoxetine, 300 mg of Seroquel XR, 300 mg bid of gabapentin and Suboxone 8/2 mg bid prescribed by a clinic. She no longer takes propranolol 20 mg tid prn anxiety. Her mood is stable, sleep and appetite good.No SI, no psychosis. She is considering tapering gabapentin off now that anxiety is low.  Visit Diagnosis:    ICD-10-CM   1. Bipolar 2 disorder (HCC)  F31.81 FLUoxetine (PROZAC) 40 MG capsule  2. GAD (generalized anxiety disorder)  F41.1 FLUoxetine (PROZAC) 40 MG capsule  3. Opioid use disorder, moderate, in early remission, on maintenance therapy (HCC)  F11.21     Past Psychiatric History: Please see intake H&P.  Past Medical History:  Past Medical History:  Diagnosis Date  . Allergy   . Anxiety   . Depression   . Heart murmur   . Protein deficiency (HCC)   . Seizures (HCC)     Past Surgical History:  Procedure Laterality Date  .  ENDARTERECTOMY FEMORAL Left 10/01/2019   Procedure: Left Femoral Endarterectomy with Patch Angioplasty;  Surgeon: Sherren Kerns, MD;  Location: [facility] OR;  Service: Vascular;  Laterality: Left;  . THORACIC AORTIC ENDOVASCULAR STENT GRAFT N/A 10/01/2019   Procedure: THORACIC AORTIC ENDOVASCULAR STENT GRAFT;  Surgeon: Sherren Kerns, MD;  Location: [facility] OR;  Service: Vascular;  Laterality: N/A;    Family Psychiatric History: Reviewed.  Family History:  Family History  Problem Relation Age of Onset  . Anxiety disorder Mother   . Cancer Mother        breast  . Anxiety disorder Brother   . Drug abuse Brother   . Alcohol abuse Paternal Grandfather   . Anxiety disorder Cousin   . Heart attack Father     Social History:  Social History   Socioeconomic History  . Marital status: Single    Spouse name: Not on file  . Number of children: Not on file  . Years of education: Not on file  . Highest education level: Not on file  Occupational History  . Not on file  Tobacco Use  . Smoking status: Current Some Day Smoker    Packs/day: 0.50    Years: 0.60    Pack years: 0.30    Types: Cigarettes  . Smokeless tobacco: Never Used  Vaping Use  . Vaping Use: Former  Substance and Sexual Activity  . Alcohol use: Not Currently  . Drug use: Not Currently    Types: Marijuana, Heroin, Other-see comments    Comment: Heroin: Last PTA, daily  basis   . Sexual activity: Yes    Birth control/protection: Implant  Other Topics Concern  . Not on file  Social History Narrative  . Not on file   Social Determinants of Health   Financial Resource Strain:   . Difficulty of Paying Living Expenses:   Food Insecurity:   . Worried About Programme researcher, broadcasting/film/video in the Last Year:   . Barista in the Last Year:   Transportation Needs:   . Freight forwarder (Medical):   Marland Kitchen Lack of Transportation (Non-Medical):   Physical Activity:   . Days of Exercise per Week:   . Minutes of Exercise per  Session:   Stress:   . Feeling of Stress :   Social Connections:   . Frequency of Communication with Friends and Family:   . Frequency of Social Gatherings with Friends and Family:   . Attends Religious Services:   . Active Member of Clubs or Organizations:   . Attends Banker Meetings:   Marland Kitchen Marital Status:     Allergies: No Known Allergies  Metabolic Disorder Labs: No results found for: HGBA1C, MPG No results found for: PROLACTIN No results found for: CHOL, TRIG, HDL, CHOLHDL, VLDL, LDLCALC No results found for: TSH  Therapeutic Level Labs: No results found for: LITHIUM No results found for: VALPROATE No components found for:  CBMZ  Current Medications: Current Outpatient Medications  Medication Sig Dispense Refill  . acetaminophen (TYLENOL) 500 MG tablet Take 2 tablets (1,000 mg total) by mouth every 6 (six) hours as needed. 30 tablet 0  . [START ON 08/06/2020] FLUoxetine (PROZAC) 40 MG capsule Take 1 capsule (40 mg total) by mouth every morning. 90 capsule 1  . gabapentin (NEURONTIN) 300 MG capsule Take 1 capsule (300 mg total) by mouth 2 (two) times daily. 180 capsule 1  . ibuprofen (ADVIL) 600 MG tablet Take 1 tablet (600 mg total) by mouth every 8 (eight) hours as needed. 30 tablet 0  . [START ON 08/06/2020] QUEtiapine (SEROQUEL XR) 300 MG 24 hr tablet Take 1 tablet (300 mg total) by mouth at bedtime. 90 tablet 1  . tiZANidine (ZANAFLEX) 2 MG tablet Take 2 mg by mouth every 6 (six) hours as needed for muscle spasms.      No current facility-administered medications for this visit.      Psychiatric Specialty Exam: Review of Systems  All other systems reviewed and are negative.   There were no vitals taken for this visit.There is no height or weight on file to calculate BMI.  General Appearance: NA  Eye Contact:  NA  Speech:  Clear and Coherent and Normal Rate  Volume:  Normal  Mood:  Euthymic  Affect:  NA  Thought Process:  Goal Directed and Linear   Orientation:  Full (Time, Place, and Person)  Thought Content: Logical   Suicidal Thoughts:  No  Homicidal Thoughts:  No  Memory:  Immediate;   Good Recent;   Good Remote;   Good  Judgement:  Good  Insight:  Good  Psychomotor Activity:  NA  Concentration:  Concentration: Good  Recall:  Good  Fund of Knowledge: Good  Language: Good  Akathisia:  Negative  Handed:  Right  AIMS (if indicated): not done  Assets:  Communication Skills Desire for Improvement Financial Resources/Insurance Housing Physical Health  ADL's:  Intact  Cognition: WNL  Sleep:  Good   Screenings: GAD-7     Office Visit from 01/29/2019 in [facility]  at [facility] from 12/13/2017 in [place] [facility]  Total GAD-7 Score 16 13    PHQ2-9     Office Visit from 01/29/2019 in [facility] from 12/13/2017 in [place] [facility] for [facility]  PHQ-2 Total Score 6 6  PHQ-9 Total Score 25 14       Assessment and Plan: 19yo single female with long hx ofpolysubstance abusewho is now in sustained remission from alcohol/cannabis, alprazolam and early remission from opioids (heroin). She has a hx of anxiety (genalized, panic, social type), depression with two OD attemptsvery likelybipolar 2 disorder (depression).Several med trials not very meaningfull as she was abusing drugs at the same time. Shehas been to residential substance abuse rehab in [place]  [facility]) and has been sober for 5 months after completion of the program. Her medications have been adjusted while in rehab: she is on 40 mg of fluoxetine, 300 mg of Seroquel XR, 300 mg bid of gabapentin and Suboxone 8/2 mg bid prescribed by a clinic. She no longer takes propranolol 20 mg tid prn anxiety. Her mood is stable, sleep and appetite good.No SI, no psychosis. She is considering tapering gabapentin off  now that anxiety is low.  Dx: Bipolar 2 disorder; GAD; Opioid use disorder on maintenance therapy.  Plan:Continuefluoxetine40 mg daily, gabapentin 300 mg bid,SeroquelXR 300 mgat HS. Next med management visit in three months.The plan was discussed with patient who had an opportunity to ask questions and these were all answered. I spend20 minutes inphone consultation with the patient.    Magdalene Patricia, MD 05/29/2020, 10:18 AM

## 2020-06-10 ENCOUNTER — Encounter (HOSPITAL_COMMUNITY): Payer: Self-pay | Admitting: Emergency Medicine

## 2020-06-10 ENCOUNTER — Ambulatory Visit (HOSPITAL_COMMUNITY)
Admission: EM | Admit: 2020-06-10 | Discharge: 2020-06-10 | Disposition: A | Discharge status: Home / Self Care | Payer: Commercial Managed Care - PPO | Attending: Family Medicine | Admitting: Family Medicine

## 2020-06-10 ENCOUNTER — Other Ambulatory Visit: Payer: Self-pay

## 2020-06-10 DIAGNOSIS — R1030 Lower abdominal pain, unspecified: Secondary | ICD-10-CM | POA: Diagnosis present

## 2020-06-10 DIAGNOSIS — Z7251 High risk heterosexual behavior: Secondary | ICD-10-CM | POA: Diagnosis not present

## 2020-06-10 LAB — HIV ANTIBODY (ROUTINE TESTING W REFLEX): HIV Screen 4th Generation wRfx: NONREACTIVE
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
## 2020-06-10 NOTE — ED Triage Notes (Addendum)
Woke today feeling feverish, sweats, chills and headache.   Denies loss of taste or smell.    Patient is requesting std check.  Patient reports minimal discharge, white.  Over the past week it has been yellow.  Complains of lower back pain and abdominal pain

## 2020-06-10 NOTE — Discharge Instructions (Signed)
Your test results will be available on MyChart You will be called if any of your tests are positive 

## 2020-06-11 LAB — CERVICOVAGINAL ANCILLARY ONLY
Bacterial Vaginitis (gardnerella): NEGATIVE
Chlamydia: NEGATIVE
Comment: NEGATIVE
Comment: NEGATIVE
Comment: NEGATIVE
Comment: NORMAL
Neisseria Gonorrhea: NEGATIVE
Trichomonas: NEGATIVE

## 2020-06-11 LAB — RPR: RPR Ser Ql: NONREACTIVE

## 2020-07-24 ENCOUNTER — Other Ambulatory Visit (HOSPITAL_COMMUNITY): Payer: Self-pay | Admitting: Psychiatry

## 2020-07-24 DIAGNOSIS — R519 Headache, unspecified: Secondary | ICD-10-CM

## 2020-09-01 ENCOUNTER — Other Ambulatory Visit: Payer: Self-pay

## 2020-09-01 ENCOUNTER — Telehealth (INDEPENDENT_AMBULATORY_CARE_PROVIDER_SITE_OTHER): Payer: Commercial Managed Care - PPO | Admitting: Psychiatry

## 2020-09-01 DIAGNOSIS — F411 Generalized anxiety disorder: Secondary | ICD-10-CM | POA: Diagnosis not present

## 2020-09-01 DIAGNOSIS — F1221 Cannabis dependence, in remission: Secondary | ICD-10-CM | POA: Diagnosis not present

## 2020-09-01 DIAGNOSIS — F1121 Opioid dependence, in remission: Secondary | ICD-10-CM | POA: Diagnosis not present

## 2020-09-01 DIAGNOSIS — F3181 Bipolar II disorder: Secondary | ICD-10-CM | POA: Diagnosis not present

## 2020-09-01 NOTE — Progress Notes (Signed)
BH MD/PA/NP OP Progress Note  09/01/2020 11:41 AM Lori Hull  MRN:  [id number] Interview was conducted by phone and I verified that I was speaking with the correct person using two identifiers. I discussed the limitations of evaluation and management by telemedicine and  the availability of in person appointments. Patient expressed understanding and agreed to proceed. Patient location - home; physician - home office.  Chief Complaint: Difficulty staying on task.  HPI: 19yo single female with long hx ofpolysubstance abusewho is now in sustained remission from alcohol/cannabis, alprazolam and early remission from opioids (heroin). She has a hx of anxiety (genalized, panic, social type), depression with two OD attemptsvery likelybipolar 2 disorder (depression).Several med trials not very meaningfull as she was abusing drugs at the same time. Lori Hull been to residential substance abuse rehab in [place]([facility])and has been sober for27months after completion of the program. Her medications have been adjusted while in rehab: she is on 40 mg of fluoxetine, 300 mg of Seroquel XR, 300 mg bid of gabapentin and Suboxone 8/2 mg bid prescribed by a clinic. She no longer takes propranolol 20 mg tid prn anxiety. Her mood is stable, sleep and appetite good.No SI, no psychosis. She has chronic problems with focusing/concentration and would like to undergo testing for possibility of ADHD. She is busy working and with school.   Visit Diagnosis:    ICD-10-CM   1. Bipolar 2 disorder (HCC)  F31.81   2. Cannabis use disorder, moderate, in sustained remission (HCC)  F12.21   3. GAD (generalized anxiety disorder)  F41.1   4. Opioid use disorder, moderate, in sustained remission, on maintenance therapy (HCC)  F11.21     Past Psychiatric History: Please see intake H&P.  Past Medical History:  Past Medical History:  Diagnosis Date  . Allergy   . Anxiety   . Depression   . Heart murmur    . Protein deficiency (HCC)   . Seizures (HCC)     Past Surgical History:  Procedure Laterality Date  . ENDARTERECTOMY FEMORAL Left 10/01/2019   Procedure: Left Femoral Endarterectomy with Patch Angioplasty;  Surgeon: Sherren Kerns, MD;  Location: [facility] OR;  Service: Vascular;  Laterality: Left;  . THORACIC AORTIC ENDOVASCULAR STENT GRAFT N/A 10/01/2019   Procedure: THORACIC AORTIC ENDOVASCULAR STENT GRAFT;  Surgeon: Sherren Kerns, MD;  Location: [facility] OR;  Service: Vascular;  Laterality: N/A;    Family Psychiatric History: Reviewed.  Family History:  Family History  Problem Relation Age of Onset  . Anxiety disorder Mother   . Cancer Mother        breast  . Anxiety disorder Brother   . Drug abuse Brother   . Alcohol abuse Paternal Grandfather   . Anxiety disorder Cousin   . Heart attack Father     Social History:  Social History   Socioeconomic History  . Marital status: Single    Spouse name: Not on file  . Number of children: Not on file  . Years of education: Not on file  . Highest education level: Not on file  Occupational History  . Not on file  Tobacco Use  . Smoking status: Current Every Day Smoker    Packs/day: 0.50    Years: 0.60    Pack years: 0.30    Types: Cigarettes  . Smokeless tobacco: Never Used  Vaping Use  . Vaping Use: Former  Substance and Sexual Activity  . Alcohol use: Not Currently  . Drug use: Not Currently  Types: Marijuana, Heroin, Other-see comments    Comment: Heroin: Last PTA, daily basis   . Sexual activity: Yes    Birth control/protection: Implant  Other Topics Concern  . Not on file  Social History Narrative  . Not on file   Social Determinants of Health   Financial Resource Strain:   . Difficulty of Paying Living Expenses: Not on file  Food Insecurity:   . Worried About Programme researcher, broadcasting/film/video in the Last Year: Not on file  . Ran Out of Food in the Last Year: Not on file  Transportation Needs:   . Lack of Transportation  (Medical): Not on file  . Lack of Transportation (Non-Medical): Not on file  Physical Activity:   . Days of Exercise per Week: Not on file  . Minutes of Exercise per Session: Not on file  Stress:   . Feeling of Stress : Not on file  Social Connections:   . Frequency of Communication with Friends and Family: Not on file  . Frequency of Social Gatherings with Friends and Family: Not on file  . Attends Religious Services: Not on file  . Active Member of Clubs or Organizations: Not on file  . Attends Banker Meetings: Not on file  . Marital Status: Not on file    Allergies: No Known Allergies  Metabolic Disorder Labs: No results found for: HGBA1C, MPG No results found for: PROLACTIN No results found for: CHOL, TRIG, HDL, CHOLHDL, VLDL, LDLCALC No results found for: TSH  Therapeutic Level Labs: No results found for: LITHIUM No results found for: VALPROATE No components found for:  CBMZ  Current Medications: Current Outpatient Medications  Medication Sig Dispense Refill  . acetaminophen (TYLENOL) 500 MG tablet Take 2 tablets (1,000 mg total) by mouth every 6 (six) hours as needed. 30 tablet 0  . buprenorphine-naloxone (SUBOXONE) 8-2 mg SUBL SL tablet Place 1 tablet under the tongue 3 (three) times daily.    Marland Kitchen FLUoxetine (PROZAC) 40 MG capsule Take 1 capsule (40 mg total) by mouth every morning. 90 capsule 1  . gabapentin (NEURONTIN) 300 MG capsule TAKE 1 CAPSULE(300 MG) BY MOUTH TWICE DAILY 180 capsule 1  . ibuprofen (ADVIL) 600 MG tablet Take 1 tablet (600 mg total) by mouth every 8 (eight) hours as needed. 30 tablet 0  . QUEtiapine (SEROQUEL XR) 300 MG 24 hr tablet Take 1 tablet (300 mg total) by mouth at bedtime. 90 tablet 1  . tiZANidine (ZANAFLEX) 2 MG tablet Take 2 mg by mouth every 6 (six) hours as needed for muscle spasms.      No current facility-administered medications for this visit.     Psychiatric Specialty Exam: Review of Systems   Psychiatric/Behavioral: Positive for decreased concentration.  All other systems reviewed and are negative.   There were no vitals taken for this visit.There is no height or weight on file to calculate BMI.  General Appearance: NA  Eye Contact:  NA  Speech:  Clear and Coherent and Normal Rate  Volume:  Normal  Mood:  Euthymic  Affect:  NA  Thought Process:  Goal Directed and Linear  Orientation:  Full (Time, Place, and Person)  Thought Content: Logical   Suicidal Thoughts:  No  Homicidal Thoughts:  No  Memory:  Immediate;   Good Recent;   Good Remote;   Good  Judgement:  Good  Insight:  Good  Psychomotor Activity:  NA  Concentration:  Concentration: Fair  Recall:  Good  Fund of Knowledge:  Good  Language: Good  Akathisia:  Negative  Handed:  Right  AIMS (if indicated): not done  Assets:  Communication Skills Desire for Improvement Financial Resources/Insurance Housing Physical Health Talents/Skills  ADL's:  Intact  Cognition: WNL  Sleep:  Good   Screenings: GAD-7     Office Visit from 01/29/2019 in [facility] from 12/13/2017 in [place] [facility] Ad[facility]  Total GAD-7 Score 16 13    PHQ2-9     Office Visit from 01/29/2019 in [facility] from 12/13/2017 in [place] [facility]  PHQ-2 Total Score 6 6  PHQ-9 Total Score 25 14       Assessment and Plan: 19yo single female with long hx ofpolysubstance abusewho is now in sustained remission from alcohol/cannabis, alprazolam and early remission from opioids (heroin). She has a hx of anxiety (genalized, panic, social type), depression with two OD attemptsvery likelybipolar 2 disorder (depression).Several med trials not very meaningfull as she was abusing drugs at the same time. Lori Hull been to residential substance abuse rehab in [place]([facility])and has been sober for85months after completion of the program. Her medications have been adjusted while in rehab: she is on 40 mg of fluoxetine, 300 mg of Seroquel XR, 300 mg bid of gabapentin and Suboxone 8/2 mg bid prescribed by a clinic. She no longer takes propranolol 20 mg tid prn anxiety. Her mood is stable, sleep and appetite good.No SI, no psychosis. She has chronic problems with focusing/concentration and would like to undergo testing for possibility of ADHD. She is busy working and with school.  Dx: Bipolar 2 disorder; GAD; Opioid use disorder in sustained remission on maintenance therapy.  Plan:Continuefluoxetine40 mg daily, gabapentin 300 mg bid,SeroquelXR 300 mgat HS. I recoimmended trying to contact Dr. Mariane Masters office reg. ADHD testing. Next med management visit in threemonths.The plan was discussed with patient who had an opportunity to ask questions and these were all answered. I spend48minutes inphone consultation with the patient.     Magdalene Patricia, MD 09/01/2020, 11:41 AM

## 2020-10-10 ENCOUNTER — Other Ambulatory Visit: Payer: Self-pay

## 2020-10-10 ENCOUNTER — Ambulatory Visit (INDEPENDENT_AMBULATORY_CARE_PROVIDER_SITE_OTHER): Payer: Commercial Managed Care - PPO | Admitting: Family Medicine

## 2020-10-10 ENCOUNTER — Other Ambulatory Visit (HOSPITAL_COMMUNITY)
Admission: RE | Admit: 2020-10-10 | Discharge: 2020-10-10 | Disposition: A | Discharge status: Home / Self Care | Payer: Commercial Managed Care - PPO | Source: Ambulatory Visit | Attending: Family Medicine | Admitting: Family Medicine

## 2020-10-10 ENCOUNTER — Encounter: Payer: Self-pay | Admitting: Family Medicine

## 2020-10-10 VITALS — BP 102/62 | HR 64 | Temp 97.8°F | Wt 157.0 lb

## 2020-10-10 DIAGNOSIS — N898 Other specified noninflammatory disorders of vagina: Secondary | ICD-10-CM

## 2020-10-10 DIAGNOSIS — F419 Anxiety disorder, unspecified: Secondary | ICD-10-CM

## 2020-10-10 DIAGNOSIS — Z3009 Encounter for other general counseling and advice on contraception: Secondary | ICD-10-CM

## 2020-10-10 DIAGNOSIS — F32A Depression, unspecified: Secondary | ICD-10-CM

## 2020-10-10 LAB — POCT URINALYSIS DIPSTICK
Bilirubin, UA: NEGATIVE
Blood, UA: NEGATIVE
Glucose, UA: NEGATIVE
Ketones, UA: NEGATIVE
Nitrite, UA: NEGATIVE
Protein, UA: NEGATIVE
Spec Grav, UA: 1.025 (ref 1.010–1.025)
Urobilinogen, UA: 0.2 E.U./dL
pH, UA: 6 (ref 5.0–8.0)

## 2020-10-10 MED ORDER — CEFTRIAXONE SODIUM 250 MG IJ SOLR
250.0000 mg | Freq: Once | INTRAMUSCULAR | Status: AC
  Administered 2020-10-10: 250 mg via INTRAMUSCULAR

## 2020-10-10 NOTE — Patient Instructions (Signed)
Contraception Choices Contraception, also called birth control, refers to methods or devices that prevent pregnancy. Hormonal methods Contraceptive implant  A contraceptive implant is a thin, plastic tube that contains a hormone. It is inserted into the upper part of the arm. It can remain in place for up to 3 years. Progestin-only injections Progestin-only injections are injections of progestin, a synthetic form of the hormone progesterone. They are given every 3 months by a health care provider. Birth control pills  Birth control pills are pills that contain hormones that prevent pregnancy. They must be taken once a day, preferably at the same time each day. Birth control patch  The birth control patch contains hormones that prevent pregnancy. It is placed on the skin and must be changed once a week for three weeks and removed on the fourth week. A prescription is needed to use this method of contraception. Vaginal ring  A vaginal ring contains hormones that prevent pregnancy. It is placed in the vagina for three weeks and removed on the fourth week. After that, the process is repeated with a new ring. A prescription is needed to use this method of contraception. Emergency contraceptive Emergency contraceptives prevent pregnancy after unprotected sex. They come in pill form and can be taken up to 5 days after sex. They work best the sooner they are taken after having sex. Most emergency contraceptives are available without a prescription. This method should not be used as your only form of birth control. Barrier methods Female condom  A female condom is a thin sheath that is worn over the penis during sex. Condoms keep sperm from going inside a woman's body. They can be used with a spermicide to increase their effectiveness. They should be disposed after a single use. Female condom  A female condom is a soft, loose-fitting sheath that is put into the vagina before sex. The condom keeps sperm  from going inside a woman's body. They should be disposed after a single use. Diaphragm  A diaphragm is a soft, dome-shaped barrier. It is inserted into the vagina before sex, along with a spermicide. The diaphragm blocks sperm from entering the uterus, and the spermicide kills sperm. A diaphragm should be left in the vagina for 6-8 hours after sex and removed within 24 hours. A diaphragm is prescribed and fitted by a health care provider. A diaphragm should be replaced every 1-2 years, after giving birth, after gaining more than 15 lb (6.8 kg), and after pelvic surgery. Cervical cap  A cervical cap is a round, soft latex or plastic cup that fits over the cervix. It is inserted into the vagina before sex, along with spermicide. It blocks sperm from entering the uterus. The cap should be left in place for 6-8 hours after sex and removed within 48 hours. A cervical cap must be prescribed and fitted by a health care provider. It should be replaced every 2 years. Sponge  A sponge is a soft, circular piece of polyurethane foam with spermicide on it. The sponge helps block sperm from entering the uterus, and the spermicide kills sperm. To use it, you make it wet and then insert it into the vagina. It should be inserted before sex, left in for at least 6 hours after sex, and removed and thrown away within 30 hours. Spermicides Spermicides are chemicals that kill or block sperm from entering the cervix and uterus. They can come as a cream, jelly, suppository, foam, or tablet. A spermicide should be inserted into the   vagina with an applicator at least 10-15 minutes before sex to allow time for it to work. The process must be repeated every time you have sex. Spermicides do not require a prescription. Intrauterine contraception Intrauterine device (IUD) An IUD is a T-shaped device that is put in a woman's uterus. There are two types:  Hormone IUD.This type contains progestin, a synthetic form of the hormone  progesterone. This type can stay in place for 3-5 years.  Copper IUD.This type is wrapped in copper wire. It can stay in place for 10 years.  Permanent methods of contraception Female tubal ligation In this method, a woman's fallopian tubes are sealed, tied, or blocked during surgery to prevent eggs from traveling to the uterus. Hysteroscopic sterilization In this method, a small, flexible insert is placed into each fallopian tube. The inserts cause scar tissue to form in the fallopian tubes and block them, so sperm cannot reach an egg. The procedure takes about 3 months to be effective. Another form of birth control must be used during those 3 months. Female sterilization This is a procedure to tie off the tubes that carry sperm (vasectomy). After the procedure, the man can still ejaculate fluid (semen). Natural planning methods Natural family planning In this method, a couple does not have sex on days when the woman could become pregnant. Calendar method This means keeping track of the length of each menstrual cycle, identifying the days when pregnancy can happen, and not having sex on those days. Ovulation method In this method, a couple avoids sex during ovulation. Symptothermal method This method involves not having sex during ovulation. The woman typically checks for ovulation by watching changes in her temperature and in the consistency of cervical mucus. Post-ovulation method In this method, a couple waits to have sex until after ovulation. Summary  Contraception, also called birth control, means methods or devices that prevent pregnancy.  Hormonal methods of contraception include implants, injections, pills, patches, vaginal rings, and emergency contraceptives.  Barrier methods of contraception can include female condoms, female condoms, diaphragms, cervical caps, sponges, and spermicides.  There are two types of IUDs (intrauterine devices). An IUD can be put in a woman's uterus to  prevent pregnancy for 3-5 years.  Permanent sterilization can be done through a procedure for males, females, or both.  Natural family planning methods involve not having sex on days when the woman could become pregnant. This information is not intended to replace advice given to you by your health care provider. Make sure you discuss any questions you have with your health care provider. Document Revised: 11/10/2017 Document Reviewed: 12/11/2016 Elsevier Patient Education  2020 [employer]  Acne  Acne is a skin problem that causes pimples and other skin changes. The skin has many tiny openings called pores. Each pore contains an oil gland. Oil glands make an oily substance that is called sebum. Acne occurs when the pores in the skin get blocked. The pores may become infected with bacteria, or they may become red, sore, and swollen. Acne is a common skin problem, especially for teenagers. It often occurs on the face, neck, chest, upper arms, and back. Acne usually goes away over time. What are the causes? Acne is caused when oil glands get blocked with sebum, dead skin cells, and dirt. The bacteria that are normally found in the oil glands then multiply and cause inflammation. Acne is commonly triggered by changes in your hormones. These hormonal changes can cause the oil glands to get bigger and  to make more sebum. Factors that can make acne worse include:  Hormone changes during: ? Adolescence. ? Women's menstrual cycles. ? Pregnancy.  Oil-based cosmetics and hair products.  Stress.  Hormone problems that are caused by certain diseases.  Certain medicines.  Pressure from headbands, backpacks, or shoulder pads.  Exposure to certain oils and chemicals.  Eating a diet high in carbohydrates that quickly turn to sugar. These include dairy products, desserts, and chocolates. What increases the risk? This condition is more likely to develop in:  Teenagers.  People who have a  family history of acne. What are the signs or symptoms? Symptoms include:  Small, red bumps (pimples or papules).  Whiteheads.  Blackheads.  Small, pus-filled pimples (pustules).  Big, red pimples or pustules that feel tender. More severe acne can cause:  An abscess. This is an infected area that contains a collection of pus.  Cysts. These are hard, painful, fluid-filled sacs.  Scars. These can happen after large pimples heal. How is this diagnosed? This condition is diagnosed with a medical history and physical exam. Blood tests may also be done. How is this treated? Treatment for this condition can vary depending on the severity of your acne. Treatment may include:  Creams and lotions that prevent oil glands from clogging.  Creams and lotions that treat or prevent infections and inflammation.  Antibiotic medicines that are applied to the skin or taken as a pill.  Pills that decrease sebum production.  Birth control pills.  Light or laser treatments.  Injections of medicine into the affected areas.  Chemicals that cause peeling of the skin.  Surgery. Your health care provider will also recommend the best way to take care of your skin. Good skin care is the most important part of treatment. Follow these instructions at home: Skin care Take care of your skin as told by your health care provider. You may be told to do these things:  Wash your skin gently at least two times each day, as well as: ? After you exercise. ? Before you go to bed.  Use mild soap.  Apply a water-based skin moisturizer after you wash your skin.  Use a sunscreen or sunblock with SPF 30 or greater. This is especially important if you are using acne medicines.  Choose cosmetics that will not block your oil glands (are noncomedogenic). Medicines  Take over-the-counter and prescription medicines only as told by your health care provider.  If you were prescribed an antibiotic medicine,  apply it or take it as told by your health care provider. Do not stop using the antibiotic even if your condition improves. General instructions  Keep your hair clean and off your face. If you have oily hair, shampoo your hair regularly or daily.  Avoid wearing tight headbands or hats.  Avoid picking or squeezing your pimples. That can make your acne worse and cause scarring.  Shave gently and only when necessary.  Keep a food journal to figure out if any foods are linked to your acne. Avoid dairy products, desserts, and chocolates.  Take steps to manage and reduce stress.  Keep all follow-up visits as told by your health care provider. This is important. Contact a health care provider if:  Your acne is not better after eight weeks.  Your acne gets worse.  You have a large area of skin that is red or tender.  You think that you are having side effects from any acne medicine. Summary  Acne is a skin  problem that causes pimples and other skin changes. Acne is a common skin problem, especially for teenagers. Acne usually goes away over time.  Acne is commonly triggered by changes in your hormones. There are many other causes, such as stress, diet, and certain medicines.  Follow your health care provider's instructions for how to take care of your skin. Good skin care is the most important part of treatment.  Take over-the-counter and prescription medicines only as told by your health care provider.  Contact your health care provider if you think that you are having side effects from any acne medicine. This information is not intended to replace advice given to you by your health care provider. Make sure you discuss any questions you have with your health care provider. Document Revised: 03/21/2018 Document Reviewed: 03/21/2018 Elsevier Patient Education  2020 [employer].

## 2020-10-10 NOTE — Progress Notes (Addendum)
Subjective:    Patient ID: Lori Hull, female    DOB: April 22, 2001, 19 y.o.   MRN: [id number]  No chief complaint on file.   HPI Pt is a 19 yo female with pmh sig for anxiety, bipolar d/o, h/o avoidant/restrictive food intake d/o, h/o cannabis use d/o, h/o opioid use d/o, h/o xanax use d/o who was seen for f/u.  Pt states she is doing ok.  In a treatment program for the last year, school, and is working.  Patient endorses having Nexplanon in place x3 years.  It is a few months past due for removal.  Patient interested in starting a different type of birth control once it is removed.  Patient inquires about birth control to help with acne.  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  Patient notes vaginal discharge x 1-1.5 weeks after sexual intercourse.  Patient notes copious amounts of white discharge similar to when she had previous STI.  Past Medical History:  Diagnosis Date  . Allergy   . Anxiety   . Depression   . Heart murmur   . Protein deficiency (HCC)   . Seizures (HCC)     No Known Allergies  ROS General: Denies fever, chills, night sweats, changes in weight, changes in appetite HEENT: Denies headaches, ear pain, changes in vision, rhinorrhea, sore throat CV: Denies CP, palpitations, SOB, orthopnea Pulm: Denies SOB, cough, wheezing GI: Denies abdominal pain, nausea, vomiting, diarrhea, constipation GU: Denies dysuria, hematuria, frequency  + vaginal discharge Msk: Denies muscle cramps, joint pains Neuro: Denies weakness, numbness, tingling Skin: Denies rashes, bruising  +acne, nexplanon in place Psych: Denies depression, anxiety, hallucinations     Objective:    Blood pressure 102/62, pulse 64, temperature 97.8 F (36.6 C), temperature source Oral, weight 157 lb (71.2 kg), SpO2 99 %.  Gen. Pleasant, well-nourished, in no distress, normal affect   HEENT: [place]/AT, face symmetric, conjunctiva clear, no scleral icterus, PERRLA, EOMI, nares patent without  drainage Lungs: no accessory muscle use Cardiovascular: RRR, no peripheral edema GU: self swab obtained Musculoskeletal: No deformities, no cyanosis or clubbing, normal tone Neuro:  A&Ox3, CN II-XII intact, normal gait Skin:  Warm, no rash.  Acne on face.   Wt Readings from Last 3 Encounters:  10/10/20 157 lb (71.2 kg)  12/20/19 163 lb (73.9 kg) (89 %, Z= 1.22)*  10/02/19 155 lb 6.8 oz (70.5 kg) (85 %, Z= 1.04)*   * Growth percentiles are based on CDC (Girls, 2-20 Years) data.    Lab Results  Component Value Date   WBC 5.1 10/03/2019   HGB 10.4 (L) 10/03/2019   HCT 31.5 (L) 10/03/2019   PLT 189 10/03/2019   GLUCOSE 93 10/03/2019   ALT 70 (H) 10/01/2019   AST 140 (H) 10/01/2019   NA 140 10/03/2019   K 3.7 10/03/2019   CL 110 10/03/2019   CREATININE 0.65 10/03/2019   BUN 7 10/03/2019   CO2 22 10/03/2019   INR 0.99 05/14/2017    Assessment/Plan:  Vaginal discharge  -Self swab obtained -Given symptoms prophylactic treatment given -Further treatment if needed based on results. -discussed safe sex practices - Plan: Cervicovaginal ancillary only, POCT urinalysis dipstick, Urine Culture, cefTRIAXone (ROCEPHIN) injection 250 mg  Birth control counseling -discussed various birth control options -discussed options for nexplanon removal -given handout  Anxiety and depression -PHQ-9 score 18 -GAD-7 score 17 -continue prozac 40 mg daily, seroquel xr 300 mg, gabapentin 300 mg TID -continue follow-up with Dr. Hinton Dyer -given strict precautions  F/u prn  Grier Mitts, MD

## 2020-10-11 LAB — URINE CULTURE
MICRO NUMBER:: 11226606
SPECIMEN QUALITY:: ADEQUATE

## 2020-10-13 ENCOUNTER — Encounter: Payer: Self-pay | Admitting: Family Medicine

## 2020-10-13 ENCOUNTER — Other Ambulatory Visit (HOSPITAL_COMMUNITY)
Admission: RE | Admit: 2020-10-13 | Discharge: 2020-10-13 | Disposition: A | Discharge status: Home / Self Care | Payer: Commercial Managed Care - PPO | Source: Ambulatory Visit | Attending: Family Medicine | Admitting: Family Medicine

## 2020-10-13 DIAGNOSIS — N898 Other specified noninflammatory disorders of vagina: Secondary | ICD-10-CM | POA: Insufficient documentation

## 2020-10-13 LAB — CERVICOVAGINAL ANCILLARY ONLY
Bacterial Vaginitis-Urine: NEGATIVE
Candida Urine: POSITIVE — AB
Chlamydia: NEGATIVE
Comment: NEGATIVE
Comment: NEGATIVE
Comment: NORMAL
Neisseria Gonorrhea: NEGATIVE
Trichomonas: NEGATIVE

## 2020-10-14 LAB — CERVICOVAGINAL ANCILLARY ONLY
Bacterial Vaginitis (gardnerella): NEGATIVE
Candida Glabrata: NEGATIVE
Candida Vaginitis: POSITIVE — AB
Chlamydia: NEGATIVE
Comment: NEGATIVE
Comment: NEGATIVE
Comment: NEGATIVE
Comment: NEGATIVE
Comment: NEGATIVE
Comment: NORMAL
Neisseria Gonorrhea: NEGATIVE
Trichomonas: NEGATIVE

## 2020-10-15 ENCOUNTER — Other Ambulatory Visit: Payer: Self-pay | Admitting: Family Medicine

## 2020-10-15 DIAGNOSIS — B379 Candidiasis, unspecified: Secondary | ICD-10-CM

## 2020-10-15 MED ORDER — FLUCONAZOLE 150 MG PO TABS: 150.0000 mg | ORAL_TABLET | Freq: Once | ORAL | 0 refills | Status: AC

## 2020-10-18 ENCOUNTER — Encounter: Payer: Self-pay | Admitting: Family Medicine

## 2020-10-21 NOTE — Telephone Encounter (Signed)
Spoke with pt state that she picked up Rx for Diflucan and took it, pt state that she is better and if she gets the symptoms again she will call the office

## 2020-10-23 ENCOUNTER — Encounter: Payer: Self-pay | Admitting: Family Medicine

## 2020-10-23 ENCOUNTER — Telehealth (INDEPENDENT_AMBULATORY_CARE_PROVIDER_SITE_OTHER): Payer: Commercial Managed Care - PPO | Admitting: Family Medicine

## 2020-10-23 DIAGNOSIS — J028 Acute pharyngitis due to other specified organisms: Secondary | ICD-10-CM

## 2020-10-23 MED ORDER — AMOXICILLIN 500 MG PO TABS: 500.0000 mg | ORAL_TABLET | Freq: Two times a day (BID) | ORAL | 0 refills | Status: AC

## 2020-10-23 NOTE — Progress Notes (Signed)
Virtual Visit via Video Note  I connected with Lori Hull on 10/23/20 at 10:00 AM EST by a video enabled telemedicine application 2/2 COVID-19 pandemic and verified that I am speaking with the correct person using two identifiers.  Location patient: home Location provider:work or home office Persons participating in the virtual visit: patient, provider  I discussed the limitations of evaluation and management by telemedicine and the availability of in person appointments. The patient expressed understanding and agreed to proceed.   HPI: Pt with a bad sore throat x 3 days.  Patient endorses white patches in back of throat and significant pain on one side.  Patient denies cough, rhinorrhea, headache, ear pain, ear pressure, N/C.  Patient has not tried anything for symptoms.  Patient denies sick contacts or eating or drinking after anyone.   ROS: See pertinent positives and negatives per HPI.  Past Medical History:  Diagnosis Date  . Allergy   . Anxiety   . Depression   . Heart murmur   . Protein deficiency (HCC)   . Seizures (HCC)     Past Surgical History:  Procedure Laterality Date  . ENDARTERECTOMY FEMORAL Left 10/01/2019   Procedure: Left Femoral Endarterectomy with Patch Angioplasty;  Surgeon: Sherren Kerns, MD;  Location[facility];  Service: Vascular;  Laterality: Left;  . THORACIC AORTIC ENDOVASCULAR STENT GRAFT N/A 10/01/2019   Procedure: THORACIC AORTIC ENDOVASCULAR STENT GRAFT;  Surgeon: Sherren Kerns, MD;  Location: [facility] OR;  Service: Vascular;  Laterality: N/A;    Family History  Problem Relation Age of Onset  . Anxiety disorder Mother   . Cancer Mother        breast  . Anxiety disorder Brother   . Drug abuse Brother   . Alcohol abuse Paternal Grandfather   . Anxiety disorder Cousin   . Heart attack Father      Current Outpatient Medications:  .  acetaminophen (TYLENOL) 500 MG tablet, Take 2 tablets (1,000 mg total) by mouth every 6 (six) hours as  needed., Disp: 30 tablet, Rfl: 0 .  buprenorphine-naloxone (SUBOXONE) 8-2 mg SUBL SL tablet, Place 1 tablet under the tongue 3 (three) times daily., Disp: , Rfl:  .  FLUoxetine (PROZAC) 40 MG capsule, Take 1 capsule (40 mg total) by mouth every morning., Disp: 90 capsule, Rfl: 1 .  gabapentin (NEURONTIN) 300 MG capsule, TAKE 1 CAPSULE(300 MG) BY MOUTH TWICE DAILY, Disp: 180 capsule, Rfl: 1 .  QUEtiapine (SEROQUEL XR) 300 MG 24 hr tablet, Take 1 tablet (300 mg total) by mouth at bedtime., Disp: 90 tablet, Rfl: 1  EXAM:  VITALS per patient if applicable: RR between 12-20 bpm  GENERAL: alert, oriented, appears fatigued and in no acute distress  HEENT: atraumatic, conjunctiva clear, no obvious abnormalities on inspection of external nose and ears  NECK: normal movements of the head and neck  LUNGS: on inspection no signs of respiratory distress, breathing rate appears normal, no obvious gross SOB, gasping or wheezing  CV: no obvious cyanosis  MS: moves all visible extremities without noticeable abnormality  PSYCH/NEURO: pleasant and cooperative, no obvious depression or anxiety, speech and thought processing grossly intact  ASSESSMENT AND PLAN:  Discussed the following assessment and plan:  Pharyngitis due to other organism -Likely strep as cause -Discussed supportive care.  Okay to use Tylenol or ibuprofen as needed for any pain or discomfort -NKDA -Will send in Rx for amoxicillin twice daily - Plan: amoxicillin (AMOXIL) 500 MG tablet  Follow-up as needed  I discussed the assessment and treatment plan with the patient. The patient was provided an opportunity to ask questions and all were answered. The patient agreed with the plan and demonstrated an understanding of the instructions.   The patient was advised to call back or seek an in-person evaluation if the symptoms worsen or if the condition fails to improve as anticipated.    Deeann Saint, MD

## 2020-11-28 ENCOUNTER — Other Ambulatory Visit: Payer: Self-pay

## 2020-11-28 DIAGNOSIS — S2509XD Other specified injury of thoracic aorta, subsequent encounter: Secondary | ICD-10-CM

## 2020-12-02 ENCOUNTER — Telehealth (INDEPENDENT_AMBULATORY_CARE_PROVIDER_SITE_OTHER): Payer: Commercial Managed Care - PPO | Admitting: Psychiatry

## 2020-12-02 ENCOUNTER — Other Ambulatory Visit: Payer: Self-pay

## 2020-12-02 DIAGNOSIS — F1121 Opioid dependence, in remission: Secondary | ICD-10-CM | POA: Diagnosis not present

## 2020-12-02 DIAGNOSIS — F411 Generalized anxiety disorder: Secondary | ICD-10-CM

## 2020-12-02 DIAGNOSIS — F3181 Bipolar II disorder: Secondary | ICD-10-CM

## 2020-12-02 DIAGNOSIS — R519 Headache, unspecified: Secondary | ICD-10-CM

## 2020-12-02 MED ORDER — QUETIAPINE FUMARATE ER 300 MG PO TB24: 300.0000 mg | ORAL_TABLET | Freq: Every day | ORAL | 1 refills | Status: DC

## 2020-12-02 MED ORDER — GABAPENTIN 300 MG PO CAPS: ORAL_CAPSULE | ORAL | 1 refills | Status: DC

## 2020-12-02 MED ORDER — FLUOXETINE HCL 40 MG PO CAPS: 40.0000 mg | ORAL_CAPSULE | Freq: Every morning | ORAL | 1 refills | Status: DC

## 2020-12-02 NOTE — Progress Notes (Signed)
BH MD/PA/NP OP Progress Note  12/02/2020 3:44 PM Lori Hull  MRN:  [id number] Interview was conducted by phone and I verified that I was speaking with the correct person using two identifiers. I discussed the limitations of evaluation and management by telemedicine and  the availability of in person appointments. Patient expressed understanding and agreed to proceed. Participants in the visit: patient (location - home); physician (location - home office).  Chief Complaint: None.  HPI: 20yo single female with long hx ofpolysubstance abusewho is now in sustained remission from alcohol/cannabis/ alprazolam and on Suboxone maintenance for opioid (heroin) addiction. She has a hx of anxiety (genalized, panic, social type), depression with two OD attemptsvery likelybipolar 2 disorder (depression).Several med trials not very meaningfull as she was abusing drugs at the same time. Shehas been to residential substance abuse rehab in [place]([facility])and has been sober forover 13 months after completion of the program. Her medications have been adjusted while in rehab: she is on 40 mg of fluoxetine, 300 mg of Seroquel XR, 300 mg bid of gabapentin andSuboxone 8/2 mg bid prescribed by a clinic. She no longer takespropranolol 20 mg tid prn anxiety. Her mood is stable, sleep and appetite good.No SI, no psychosis.She is busy working and with school.    Visit Diagnosis:    ICD-10-CM   1. Bipolar 2 disorder (HCC)  F31.81 FLUoxetine (PROZAC) 40 MG capsule  2. GAD (generalized anxiety disorder)  F41.1 FLUoxetine (PROZAC) 40 MG capsule  3. Persistent headaches  R51.9 gabapentin (NEURONTIN) 300 MG capsule  4. Opioid use disorder, moderate, in sustained remission, on maintenance therapy (HCC)  F11.21     Past Psychiatric History: Please see intake H&P.  Past Medical History:  Past Medical History:  Diagnosis Date  . Allergy   . Anxiety   . Depression   . Heart murmur   . Protein  deficiency (HCC)   . Seizures (HCC)     Past Surgical History:  Procedure Laterality Date  . ENDARTERECTOMY FEMORAL Left 10/01/2019   Procedure: Left Femoral Endarterectomy with Patch Angioplasty;  Surgeon: Sherren Kerns, MD;  Location: [facility] OR;  Service: Vascular;  Laterality: Left;  . THORACIC AORTIC ENDOVASCULAR STENT GRAFT N/A 10/01/2019   Procedure: THORACIC AORTIC ENDOVASCULAR STENT GRAFT;  Surgeon: Sherren Kerns, MD;  [facility] OR;  Service: Vascular;  Laterality: N/A;    Family Psychiatric History: Reviewed.  Family History:  Family History  Problem Relation Age of Onset  . Anxiety disorder Mother   . Cancer Mother        breast  . Anxiety disorder Brother   . Drug abuse Brother   . Alcohol abuse Paternal Grandfather   . Anxiety disorder Cousin   . Heart attack Father     Social History:  Social History   Socioeconomic History  . Marital status: Single    Spouse name: Not on file  . Number of children: Not on file  . Years of education: Not on file  . Highest education level: Not on file  Occupational History  . Not on file  Tobacco Use  . Smoking status: Current Every Day Smoker    Packs/day: 0.50    Years: 0.60    Pack years: 0.30    Types: Cigarettes  . Smokeless tobacco: Never Used  Vaping Use  . Vaping Use: Former  Substance and Sexual Activity  . Alcohol use: Not Currently  . Drug use: Not Currently    Types: Marijuana, Heroin, Other-see comments  Comment: Heroin: Last PTA, daily basis   . Sexual activity: Yes    Birth control/protection: Implant  Other Topics Concern  . Not on file  Social History Narrative  . Not on file   Social Determinants of Health   Financial Resource Strain: Not on file  Food Insecurity: Not on file  Transportation Needs: Not on file  Physical Activity: Not on file  Stress: Not on file  Social Connections: Not on file    Allergies: No Known Allergies  Metabolic Disorder Labs: No results found for:  HGBA1C, MPG No results found for: PROLACTIN No results found for: CHOL, TRIG, HDL, CHOLHDL, VLDL, LDLCALC No results found for: TSH  Therapeutic Level Labs: No results found for: LITHIUM No results found for: VALPROATE No components found for:  CBMZ  Current Medications: Current Outpatient Medications  Medication Sig Dispense Refill  . acetaminophen (TYLENOL) 500 MG tablet Take 2 tablets (1,000 mg total) by mouth every 6 (six) hours as needed. 30 tablet 0  . buprenorphine-naloxone (SUBOXONE) 8-2 mg SUBL SL tablet Place 1 tablet under the tongue 3 (three) times daily.    Melene Muller ON 02/02/2021] FLUoxetine (PROZAC) 40 MG capsule Take 1 capsule (40 mg total) by mouth every morning. 90 capsule 1  . [START ON 01/21/2021] gabapentin (NEURONTIN) 300 MG capsule TAKE 1 CAPSULE(300 MG) BY MOUTH TWICE DAILY 180 capsule 1  . [START ON 02/02/2021] QUEtiapine (SEROQUEL XR) 300 MG 24 hr tablet Take 1 tablet (300 mg total) by mouth at bedtime. 90 tablet 1   No current facility-administered medications for this visit.     Psychiatric Specialty Exam: Review of Systems  All other systems reviewed and are negative.   There were no vitals taken for this visit.There is no height or weight on file to calculate BMI.  General Appearance: NA  Eye Contact:  NA  Speech:  Clear and Coherent and Normal Rate  Volume:  Normal  Mood:  Euthymic  Affect:  NA  Thought Process:  Goal Directed  Orientation:  Full (Time, Place, and Person)  Thought Content: Logical   Suicidal Thoughts:  No  Homicidal Thoughts:  No  Memory:  Immediate;   Good Recent;   Good Remote;   Good  Judgement:  Good  Insight:  Good  Psychomotor Activity:  NA  Concentration:  Concentration: Fair  Recall:  Good  Fund of Knowledge: Good  Language: Good  Akathisia:  Negative  Handed:  Right  AIMS (if indicated): not done  Assets:  Communication Skills Desire for Improvement Financial Resources/Insurance Housing Talents/Skills  ADL's:   Intact  Cognition: WNL  Sleep:  Good   Screenings: GAD-7   Flowsheet Row Office Visit from 10/10/2020 in [facility] [place] Office Visit from 01/29/2019 in [facility] from 12/13/2017 in [facility]  Total GAD-7 Score 17 16 13     PHQ2-9   Flowsheet Row Office Visit from 10/10/2020 in [facility] [place] Office Visit from 01/29/2019 in [facility] from 12/13/2017 in [facility]  PHQ-2 Total Score 3 6 6   PHQ-9 Total Score 18 25 14        Assessment and Plan: 19yo single female with long hx ofpolysubstance abusewho is now in sustained remission from alcohol/cannabis/ alprazolam and on Suboxone maintenance for opioid (heroin) addiction. She has a hx of anxiety (genalized, panic, social type),  depression with two OD attemptsvery likelybipolar 2 disorder (depression).Several med trials not very meaningfull as she was abusing drugs at the same time. Shehas been to residential substance abuse rehab in [place]([facility])and has been sober forover 13 months after completion of the program. Her medications have been adjusted while in rehab: she is on 40 mg of fluoxetine, 300 mg of Seroquel XR, 300 mg bid of gabapentin andSuboxone 8/2 mg bid prescribed by a clinic. She no longer takespropranolol 20 mg tid prn anxiety. Her mood is stable, sleep and appetite good.No SI, no psychosis.She is busy working and with school.  Dx: Bipolar 2 disorder; GAD;Opioid use disorder in sustained remission on maintenance therapy.  Plan:Continuefluoxetine40 mg daily, gabapentin 300 mg bid,SeroquelXR 300 mgat HS. I asked her to have fasting lipid panel and glucose checked - she prefers to have it done at her PCP office. Next med management visit in threemonths with a new  provider.The plan was discussed with patient who had an opportunity to ask questions and these were all answered. I spend28minutes inphone consultation with the patient.   Magdalene Patricia, MD 12/02/2020, 3:44 PM

## 2020-12-24 ENCOUNTER — Telehealth: Payer: Self-pay | Admitting: Family Medicine

## 2020-12-24 NOTE — Telephone Encounter (Signed)
Patient is wanting to know if Dr. Salomon Fick can remove her Birth Control Implant that she has inserted either at the hospital.  She has had the implant for 3 years.  Please advise

## 2020-12-24 NOTE — Telephone Encounter (Signed)
Please advise if ok to schedule pt appointment 

## 2020-12-26 ENCOUNTER — Other Ambulatory Visit: Payer: Commercial Managed Care - PPO

## 2020-12-26 NOTE — Telephone Encounter (Signed)
Spoke with pt states that she has had Nexplanon for 3 years and 6 months. Pt wants to start Birth control pills

## 2020-12-26 NOTE — Telephone Encounter (Signed)
Does patient have a Nexplanon?  Is pt trying to have another placed?

## 2021-01-01 ENCOUNTER — Ambulatory Visit: Payer: Commercial Managed Care - PPO | Admitting: Vascular Surgery

## 2021-01-08 ENCOUNTER — Ambulatory Visit: Payer: Commercial Managed Care - PPO | Admitting: Vascular Surgery

## 2021-01-12 ENCOUNTER — Other Ambulatory Visit: Payer: Self-pay

## 2021-01-12 ENCOUNTER — Ambulatory Visit (INDEPENDENT_AMBULATORY_CARE_PROVIDER_SITE_OTHER): Payer: Commercial Managed Care - PPO | Admitting: Family Medicine

## 2021-01-12 ENCOUNTER — Encounter: Payer: Self-pay | Admitting: Family Medicine

## 2021-01-12 VITALS — BP 100/70 | HR 81 | Temp 97.6°F | Wt 157.2 lb

## 2021-01-12 DIAGNOSIS — N912 Amenorrhea, unspecified: Secondary | ICD-10-CM

## 2021-01-12 DIAGNOSIS — Z30011 Encounter for initial prescription of contraceptive pills: Secondary | ICD-10-CM

## 2021-01-12 DIAGNOSIS — Z3046 Encounter for surveillance of implantable subdermal contraceptive: Secondary | ICD-10-CM | POA: Diagnosis not present

## 2021-01-12 MED ORDER — NORETHINDRON-ETHINYL ESTRAD-FE 1-20/1-30/1-35 MG-MCG PO TABS: 1.0000 | ORAL_TABLET | Freq: Every day | ORAL | 11 refills | Status: DC

## 2021-01-12 NOTE — Patient Instructions (Addendum)
Contraceptive Implant Information A contraceptive implant is a small, plastic rod that is inserted under the skin of the upper arm. The implant releases a hormone into the bloodstream that prevents pregnancy. They do not protect against STIs (sexually transmitted infections). How does the implant work? Contraceptive implants prevent pregnancy by releasing a small amount of progestin into the bloodstream. Progestin has similar effects to the hormone progesterone, which plays a role in menstrual periods and pregnancy. Progestin will:  Stop the ovaries from releasing eggs.  Thicken cervical mucus to prevent sperm from entering the cervix.  Thin out the lining of the uterus to prevent a fertilized egg from attaching to the wall of the uterus. What are the advantages of this form of birth control? The advantages of this form of birth control include the following:  It is very effective at preventing pregnancy.  It is effective for up to 3 years.  It does not interfere with sex or daily activities. It cannot be seen by others, but it can be felt.  It can be used when breastfeeding.  It can be used by women who cannot take estrogen.  The procedure to insert the device is quick. It can also be easily removed.  Women can get pregnant shortly after removing the device. What are the disadvantages of this form of birth control? The disadvantages of this form of birth control include the following:  It can cause side effects, including: ? Changes in your bleeding pattern, such as:  Bleeding for a longer or shorter time during your menstrual period.  No bleeding at all during the time of your menstrual period.  Spotting between your menstrual periods.  Changes in the amount of time between your menstrual periods. ? Headache. ? Weight gain. ? Acne. ? Breast tenderness. ? Abdominal or back pain. ? Mood changes, such as depression.  It does not protect against STIs.  You must make an  office visit to have it inserted and removed by a trained clinician.  Inserting or removing the device can result in pain, scarring, and tissue or nerve damage. This is rare. How is this implant inserted? The procedure to insert an implant only takes a few minutes. Before the procedure:  You should talk with your health care provider about when to schedule the procedure.  You may have to take a pregnancy test. This involves having a urine sample taken. During the procedure:  Your upper arm will be numbed with a numbing medicine (local anesthetic).  The implant will be injected under the skin of your upper arm with a needle. After the procedure:  You may experience minor bruising, swelling, or discomfort at the insertion site. This should only last for a couple of days.  You may need to use another, nonhormonal contraceptive such as a condom for 7 days after the procedure.   How is the implant removed? The implant should be removed after 3 years or as directed by your health care provider. The procedure to remove the implant only takes a few minutes. During this procedure:  Your upper arm will be numbed with a numbing medicine (local anesthetic).  A small incision will be made near the implant.  The implant will be removed with a small pair of forceps. After the implant is removed:  The effect of the implant will wear off in a few hours. Some women may be able to get pregnant as early as one week after the removal.  A new implant can be  inserted as soon as the old one is removed, if desired.  You may get minor bruising, swelling, or discomfort at the removal site. This should only last for a couple of days. Is this implant right for me? Your health care provider can help you determine whether you are a good candidate for a contraceptive implant. Make sure to discuss the possible side effects with your health care provider. You should not get the implant if you:  Are  pregnant.  Are allergic to any part of the implant.  Have a history of: ? Breast cancer. ? Abnormal bleeding from the vagina. ? Liver disease or tumors. If you have any of the following conditions, you should talk with your health care provider to see if a contraceptive implant is right for you:  Diabetes.  Blood clots  High cholesterol.  Headaches.  Gallbladder or kidney problems.  Depression.  High blood pressure.  Allergic reactions to medicines.   Follow these instructions at home:  If you cannot feel the implant, contact your health care provider.  Following the insertion, you will have to wear a pressure bandage for 24 hours and a small bandage for 3-5 days. Summary  A contraceptive implant is a small, plastic rod that is inserted under the skin of the upper arm. The implant releases a hormone into the bloodstream that prevents pregnancy.  Contraceptive implants can be effective for up to 3 years.  The implant works by preventing ovaries from releasing eggs, thickening the cervical mucus, and thinning the uterine wall.  This form of birth control is very effective at preventing pregnancy and can be inserted and removed quickly. Women can get pregnant shortly after the device is removed.  This form of birth control can cause some side effects, including weight gain, breast tenderness, headaches, irregular menstrual periods or bleeding, acne, abdominal or back pain, and depression. It does not protect against STIs (sexually transmitted infections). This information is not intended to replace advice given to you by your health care provider. Make sure you discuss any questions you have with your health care provider. Document Revised: 05/21/2020 Document Reviewed: 05/21/2020 Elsevier Patient Education  2021 [employer]  Oral Contraception Use Oral contraceptive pills (OCPs) are medicines that prevent pregnancy. OCPs work by:  Preventing the ovaries from releasing  eggs.  Thickening mucus in the lower part of the uterus (cervix). This prevents sperm from entering the uterus.  Thinning the lining of the uterus (endometrium). This prevents a fertilized egg from attaching to the endometrium. Discuss possible side effects of OCPs with your health care provider. It can take 2-3 months for your body to adjust to changes in hormone levels. What are the risks? OCPs can sometimes cause side effects, such as:  Headache.  Depression.  Trouble sleeping.  Nausea and vomiting.  Breast tenderness.  Irregular bleeding or spotting during the first several months.  Bloating or fluid retention.  Increase in blood pressure. OCPs with estrogen and progestins may slightly increase the risk of:  Blood clots.  Heart attack.  Stroke. How to take OCPs Follow instructions from your health care provider about how to take your first cycle of OCPs. There are 2 types of OCPs. The first, combination OCPs, have both estrogen and progestins. The second, progestin-only pills, have only progestin.  For combination OCPs, you may start the pill: ? On day 1 of your menstrual period. ? On the first Sunday after your period starts, or on the day you get your prescription. ?  At any time of your cycle. ? If you start taking the pill within 5 days after the start of your period, you will not need a backup form of birth control, such as condoms. ? If you start at any other time of your menstrual cycle, you will need to use a backup form of birth control.  For progestin-only OCPs: ? Ideally, you can start taking the pill on the first day of your menstrual period, but you can start it on any other day too. ? These pills will protect you from pregnancy after taking it for 2 days (48 hours). You can stop using a backup form of birth control after that time. It is important that you take this pill at the same time every day. Even taking it 3 hours late can increase the risk of  pregnancy. No matter which day you start the OCP, you will always start a new pack on that same day of the week. Have an extra pack of OCPs and a backup contraceptive method available in case you miss some pills or lose your OCP pack. Missed doses Follow instructions from your health care provider for missed doses. Information about missed doses can also be found in the patient information sheet that comes with your pack of pills.  In general, for combined OCPs: ? If you forget to take the pill for 1 day, take it as soon as you can. This may mean taking 2 pills on the same day and at the same time. Take the next day's pill at the regular time. ? If you forget to take the pill for 2 days in a row, take 2 tablets on the day you remember and 2 tablets on the following day. A backup form of birth control should be used for 7 days after you are back on schedule. ? If you forget to take the pill for 3 days in a row, call your health care provider for directions on when to restart taking your pills. Do not take the missed pills. A backup form of birth control will be needed for 7 days once you restart your pills. ? If you use a pack that contains inactive pills and you miss 1 or more of the inactive pills, you do not need to take the missed doses. Skip them and start the new pack on the regular day.  For progestin-only OCPs: ? If your dose is 3 hours or more late, or if you miss 1 or more doses, take 1 missed pill as soon as you can. ? If you miss one or more doses, you must use a backup form of birth control. Some brands of progestin-only pills recommend using a backup form of birth control for 48 hours after a missed or late dose while others recommend 7 days. If you are not sure what to do, call your health care provider or check the patient information sheet that came with your pills.   Follow these instructions at home:  Do not use any products that contain nicotine or tobacco. These include  cigarettes, chewing tobacco, or vaping devices, such as e-cigarettes. If you need help quitting, ask your health care provider.  Always use a condom to protect against STIs (sexually transmitted infections). Oral contraception pills do not protect against STIs.  Use a calendar to mark the days of your menstrual period.  Read the information sheet and directions that came with your OCP. Talk to your health care provider if you have questions.  Contact a health care provider if:  You develop nausea and vomiting.  You have abnormal vaginal discharge or bleeding.  You develop a rash.  You miss your menstrual period. Depending on the type of OCP you are taking, this may be a sign of pregnancy.  You are losing your hair.  You need treatment for mood swings or depression.  You get dizzy when taking the OCP.  You develop acne after taking the OCP.  You become pregnant or think you may be pregnant.  You have diarrhea, constipation, and abdominal pain or cramps.  You are not sure what to do after missing pills. Get help right away if:  You develop chest pain.  You develop shortness of breath.  You have an uncontrolled or severe headache.  You develop numbness or slurred speech.  You develop vision or speech problems.  You develop pain, redness, and swelling in your legs.  You develop weakness or numbness in your arms or legs. These symptoms may represent a serious problem that is an emergency. Do not wait to see if the symptoms will go away. Get medical help right away. Call your local emergency services (911 in the [place] Do not drive yourself to the hospital. Summary  Oral contraceptive pills (OCPs) are medicines that you take to prevent pregnancy.  OCPs do not prevent sexually transmitted infections (STIs). Always use a condom to protect against STIs.  When you start an OCP, be aware that it can take 2-3 months for your body to adjust to changes in hormone levels.  Read  all the information and directions that come with your OCP. This information is not intended to replace advice given to you by your health care provider. Make sure you discuss any questions you have with your health care provider. Document Revised: 07/17/2020 Document Reviewed: 07/17/2020 Elsevier Patient Education  2021 [employer] Inc.  Oral Contraception Information Oral contraceptive pills (OCPs) are medicines taken by mouth to prevent pregnancy. They work by:  Preventing the ovaries from releasing eggs.  Thickening mucus in the lower part of the uterus (cervix). This prevents sperm from entering the uterus.  Thinning the lining of the uterus (endometrium). This prevents a fertilized egg from attaching to the endometrium. OCPs are highly effective when taken exactly as prescribed. However, OCPs do not prevent STIs (sexually transmitted infections). Using condoms while on an OCP can help prevent STIs. What happens before starting OCPs? Before you start taking OCPs:  You may have a physical exam, blood test, and Pap test.  Your health care provider will make sure you are a good candidate for oral contraception. OCPs are not a good option for certain women, such as: ? Women who smoke and are older than age 92. ? Women who have or have had certain conditions, such as:  A history of high blood pressure.  Deep vein thrombosis.  Pulmonary embolism.  Stroke.  Cardiovascular disease.  Peripheral vascular disease. Ask your health care provider about the possible side effects of the OCP you may be prescribed. Be aware that it can take 2-3 months for your body to adjust to changes in hormone levels. Types of oral contraception Birth control pills contain the hormones estrogen and progestin (synthetic progesterone) or progestin only. The combination pill This type of pill contains estrogen and progestin hormones.  Conventional contraception pills come in packs of 21 or 28 pills. ? Some  packs with 28-day pills contain estrogen and progestin for the first 21-24 days. Hormone-free tablets, called  placebos, are taken for the final 4-7 days. You should have menstrual bleeding during the time you take the placebos. ? In packs with 21 tablets, you take no pills for 7 days. Menstrual bleeding occurs during these days. (Some people prefer taking a pill for 28 days to help establish a routine).  Extended-interval contraception pills come in packs of 91 pills. The first 84 tablets have both estrogen and progestin. The last 7 pills are placebos. Menstrual bleeding occurs during the placebo days. With this schedule, menstrual bleeding happens once every 3 months.  Continuous contraception pills come in packs of 28 pills. All pills in the pack contain estrogen and progestin. With this schedule, regular menstrual bleeding does not happen, but there may be spotting or irregular bleeding. Progestin-only pills This type of pill is often called the mini-pill and contains the progestin hormone only. It comes in packs of 28 pills. In some packs, the last 4 pills are placebos. The pill must be taken at the same time every day. This is very important to prevent pregnancy. Menstrual bleeding may not be regular or predictable.   What are the advantages? Oral contraception provides reliable and continuous contraception if taken as directed. It may treat or decrease symptoms of:  Menstrual period cramps.  Irregular menstrual cycle or bleeding.  Heavy menstrual flow.  Abnormal uterine bleeding.  Acne, depending on the type of pill.  Polycystic ovarian syndrome (POS).  Endometriosis.  Iron deficiency anemia.  Premenstrual symptoms, including severe irritability, depression, or anxiety. It also may:  Reduce the risk of endometrial and ovarian cancer.  Be used as emergency contraception.  Prevent ectopic pregnancies and infections of the fallopian tubes. What can make OCPs less  effective? OCPs may be less effective if:  You forget to take the pill every day. For progestin-only pills, it is especially important to take the pill at the same time each day. Even taking it 3 hours late can increase the risk of pregnancy.  You have a stomach or intestinal disease that reduces your body's ability to absorb the pill.  You take OCPs with other medicines that make OCPs less effective, such as antibiotics, certain HIV medicines, and some seizure medicines.  You take expired OCPs.  You forget to restart the pill after 7 days of not taking it. This refers to the packs of 21 pills. What are the side effects and risks? OCPs can sometimes cause side effects, such as:  Headache.  Depression.  Trouble sleeping.  Nausea and vomiting.  Breast tenderness.  Irregular bleeding or spotting during the first several months.  Bloating or fluid retention.  Increase in blood pressure. Combination pills may slightly increase the risk of:  Blood clots.  Heart attack.  Stroke. Follow these instructions at home: Follow instructions from your health care provider about how to start taking your first cycle of OCPs. Depending on when you start the pill, you may need to use a backup form of birth control, such as condoms, during the first week. Make sure you know what steps to take if you forget to take the pill. Summary  Oral contraceptive pills (OCPs) are medicines taken by mouth to prevent pregnancy. They are highly effective when taken exactly as prescribed.  OCPs contain a combination of the hormones estrogen and progestin (synthetic progesterone) or progestin only.  Before you start taking the pill, you may have a physical exam, blood test, and Pap test. Your health care provider will make sure you are a good  candidate for oral contraception.  The combination pill may come in a 21-day pack, a 28-day pack, or a 91-day pack. Progestin-only pills come in packs of 28  pills.  OCPs can sometimes cause side effects, such as headache, nausea, breast tenderness, or irregular bleeding. This information is not intended to replace advice given to you by your health care provider. Make sure you discuss any questions you have with your health care provider. Document Revised: 08/08/2020 Document Reviewed: 07/17/2020 Elsevier Patient Education  2021 [employer].

## 2021-01-12 NOTE — Progress Notes (Signed)
Subjective:    Patient ID: Lori Hull, female    DOB: Oct 14, 2001, 20 y.o.   MRN: [id number]  Chief Complaint  Patient presents with  . Contraception    Wants to remove [facility]     HPI Patient was seen today for nexplanon removal. Nexplanon placed 05/20/17.  In place >3 yrs.  Pt want to be on OCPs.  Denies h/o blood clots.  Was on  OCPs in the past, but had difficulty remembering to take them.  Pt notes doing better with taking her meds each day.  Pt denies recent sexual intercourse.  LMP was 1.5 months ago.  Notes h/o irregular menses as a teen.  Past Medical History:  Diagnosis Date  . Allergy   . Anxiety   . Depression   . Heart murmur   . Protein deficiency (HCC)   . Seizures (HCC)     No Known Allergies  ROS General: Denies fever, chills, night sweats, changes in weight, changes in appetite HEENT: Denies headaches, ear pain, changes in vision, rhinorrhea, sore throat CV: Denies CP, palpitations, SOB, orthopnea Pulm: Denies SOB, cough, wheezing GI: Denies abdominal pain, nausea, vomiting, diarrhea, constipation GU: Denies dysuria, hematuria, frequency, vaginal discharge Msk: Denies muscle cramps, joint pains Neuro: Denies weakness, numbness, tingling Skin: Denies rashes, bruising Psych: Denies depression, anxiety, hallucinations    Objective:    Blood pressure 100/70, pulse 81, temperature 97.6 F (36.4 C), temperature source Oral, weight 157 lb 3.2 oz (71.3 kg), SpO2 97 %.   Gen. Pleasant, well-nourished, in no distress, normal affect   HEENT: [place]/AT, face symmetric, conjunctiva clear, no scleral icterus, PERRLA, EOMI, nares patent without drainage Lungs: no accessory muscle use Cardiovascular: RRR,no peripheral edema Neuro:  A&Ox3, CN II-XII intact, normal gait Skin:  Warm, no lesions/ rash  Nexplanon palpated in L medial upper extremity just underneath the skin.   Wt Readings from Last 3 Encounters:  01/12/21 157 lb 3.2 oz (71.3 kg)  10/10/20 157 lb (71.2 kg)   12/20/19 163 lb (73.9 kg) (89 %, Z= 1.22)*   * Growth percentiles are based on CDC (Girls, 2-20 Years) data.    Lab Results  Component Value Date   WBC 5.1 10/03/2019   HGB 10.4 (L) 10/03/2019   HCT 31.5 (L) 10/03/2019   PLT 189 10/03/2019   GLUCOSE 93 10/03/2019   ALT 70 (H) 10/01/2019   AST 140 (H) 10/01/2019   NA 140 10/03/2019   K 3.7 10/03/2019   CL 110 10/03/2019   CREATININE 0.65 10/03/2019   BUN 7 10/03/2019   CO2 22 10/03/2019   INR 0.99 05/14/2017   PROCEDURE NOTE: NEXPLANON  REMOVAL Patient given informed consent. Left arm area prepped and draped in the usual sterile fashion. Lidocaine without epinephrine 1% used for local anesthesia.  A small stab incision was made close to the nexplanon with scalpel. Hemostats were used to withdraw the nexplanon.  A small bandage was applied over a steri strip.  No complications.  Patient given follow up instructions should she experience redness, swelling at sight or fever in the next 24 hours. Patient was reminded this totally removes her nexplanon contraceptive devise. (she can now potentially conceive)  Billing Code: [id number]   Assessment/Plan:  Encounter for Nexplanon removal -implant placed 05/20/17, was over due for removal on 05/20/20. -consent obtained.  Device removed without issue.  Pt tolerated procedure well. -given precuations  Absent menses  -UHcg negative. - Plan: POCT urine pregnancy  Encounter for  counseling  -  reviewed birth control options -pt interested in OCPs.  Discussed the importance of taking pills everyday at the same time.  Pt advised to set an alarm in her phone to help with this. -Advised on risk of becoming pregnant while taking current daily medications. - Plan: norethindrone-ethinyl estradiol-iron (ESTROSTEP FE) 1-20/1-30/1-35 MG-MCG tablet  F/u in 1 month, sooner if needed.  Abbe Amsterdam, MD

## 2021-01-21 ENCOUNTER — Other Ambulatory Visit (HOSPITAL_COMMUNITY): Payer: Self-pay | Admitting: Psychiatry

## 2021-01-21 DIAGNOSIS — R519 Headache, unspecified: Secondary | ICD-10-CM

## 2021-04-08 ENCOUNTER — Telehealth (HOSPITAL_COMMUNITY): Payer: Commercial Managed Care - PPO | Admitting: Psychiatry

## 2021-04-15 ENCOUNTER — Telehealth (INDEPENDENT_AMBULATORY_CARE_PROVIDER_SITE_OTHER): Payer: Commercial Managed Care - PPO | Admitting: Psychiatry

## 2021-04-15 ENCOUNTER — Other Ambulatory Visit: Payer: Self-pay

## 2021-04-15 ENCOUNTER — Encounter (HOSPITAL_COMMUNITY): Payer: Self-pay | Admitting: Psychiatry

## 2021-04-15 DIAGNOSIS — F1121 Opioid dependence, in remission: Secondary | ICD-10-CM | POA: Diagnosis not present

## 2021-04-15 DIAGNOSIS — F1221 Cannabis dependence, in remission: Secondary | ICD-10-CM

## 2021-04-15 DIAGNOSIS — F410 Panic disorder [episodic paroxysmal anxiety] without agoraphobia: Secondary | ICD-10-CM | POA: Diagnosis not present

## 2021-04-15 DIAGNOSIS — F3181 Bipolar II disorder: Secondary | ICD-10-CM | POA: Diagnosis not present

## 2021-04-15 MED ORDER — QUETIAPINE FUMARATE 100 MG PO TABS
100.0000 mg | ORAL_TABLET | Freq: Every day | ORAL | 2 refills | Status: DC
Start: 2021-04-15 — End: 2021-07-08

## 2021-04-15 NOTE — Progress Notes (Signed)
BH MD/PA/NP OP Progress Note  04/21/2021 1:10 PM Lori Hull  MRN:  [id number] Interview was conducted by phone and I verified that I was speaking with the correct person using two identifiers. I discussed the limitations of evaluation and management by telemedicine and  the availability of in person appointments. Patient expressed understanding and agreed to proceed. Participants in the visit: patient (location - home); physician (location - home office).  Chief Complaint: doing well  HPI: Patient is a 19yo single female with long hx ofpolysubstance abusewho is now in sustained remission from alcohol/cannabis/ alprazolam and on Suboxone maintenance for opioid (heroin) addiction. She has a hx of anxiety (genalized, panic, social type), depression. Patient was previously seen by Dr.Pucilowska, who is no longer with [facility]. She reports she is working this summer and will be taking classes in the fall. Doing well overall. She would like to decrease the dose of Seroquel from 300mg  to 100mg  daily. Denies use of alcohol or other substances. Denies any suicidal thoughts.  Per Dr.Pucilowska, Several med trials not very meaningfull as she was abusing drugs at the same time. Shehas been to residential substance abuse rehab in [place]([facility])and has been sober forover 13 months after completion of the program. Her medications have been adjusted while in rehab: she is on 40 mg of fluoxetine, 300 mg of Seroquel XR, 300 mg bid of gabapentin andSuboxone 8/2 mg bid prescribed by a clinic. She no longer takespropranolol 20 mg tid prn anxiety. Her mood is stable, sleep and appetite good.No SI, no psychosis.She is busy working and with school.    Visit Diagnosis:    ICD-10-CM   1. Bipolar 2 disorder (HCC)  F31.81   2. Panic disorder  F41.0   3. Opioid use disorder, moderate, in sustained remission, on maintenance therapy (HCC)  F11.21   4. Cannabis use disorder, moderate, in  sustained remission (HCC)  F12.21     Past Psychiatric History: Please see intake H&P.  Past Medical History:  Past Medical History:  Diagnosis Date  . Allergy   . Anxiety   . Depression   . Heart murmur   . Protein deficiency (HCC)   . Seizures (HCC)     Past Surgical History:  Procedure Laterality Date  . ENDARTERECTOMY FEMORAL Left 10/01/2019   Procedure: Left Femoral Endarterectomy with Patch Angioplasty;  Surgeon: 02-27-1973, MD;  [facility];  Service: Vascular;  Laterality: Left;  . THORACIC AORTIC ENDOVASCULAR STENT GRAFT N/A 10/01/2019   Procedure: THORACIC AORTIC ENDOVASCULAR STENT GRAFT;  Surgeon: [facility], MD;  Location: [facility] OR;  Service: Vascular;  Laterality: N/A;    Family Psychiatric History: Reviewed.  Family History:  Family History  Problem Relation Age of Onset  . Anxiety disorder Mother   . Cancer Mother        breast  . Anxiety disorder Brother   . Drug abuse Brother   . Alcohol abuse Paternal Grandfather   . Anxiety disorder Cousin   . Heart attack Father     Social History:  Social History   Socioeconomic History  . Marital status: Single    Spouse name: Not on file  . Number of children: Not on file  . Years of education: Not on file  . Highest education level: Not on file  Occupational History  . Not on file  Tobacco Use  . Smoking status: Current Every Day Smoker    Packs/day: 0.50    Years: 0.60  Pack years: 0.30    Types: Cigarettes  . Smokeless tobacco: Never Used  Vaping Use  . Vaping Use: Former  Substance and Sexual Activity  . Alcohol use: Not Currently  . 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 Sexual activity: Yes    Birth control/protection: Implant  Other Topics Concern  . Not on file  Social History Narrative  . Not on file   Social Determinants of Health   Financial Resource Strain: Not on file  Food  Insecurity: Not on file  Transportation Needs: Not on file  Physical Activity: Not on file  Stress: Not on file  Social Connections: Not on file    Allergies: No Known Allergies  Metabolic Disorder Labs: No results found for: HGBA1C, MPG No results found for: PROLACTIN No results found for: CHOL, TRIG, HDL, CHOLHDL, VLDL, LDLCALC No results found for: TSH  Therapeutic Level Labs: No results found for: LITHIUM No results found for: VALPROATE No components found for:  CBMZ  Current Medications: Current Outpatient Medications  Medication Sig Dispense Refill  . QUEtiapine (SEROQUEL) 100 MG tablet Take 1 tablet (100 mg total) by mouth at bedtime. 30 tablet 2  . acetaminophen (TYLENOL) 500 MG tablet Take 2 tablets (1,000 mg total) by mouth every 6 (six) hours as needed. 30 tablet 0  . buprenorphine-naloxone (SUBOXONE) 8-2 mg SUBL SL tablet Place 1 tablet under the tongue 3 (three) times daily.    Marland Kitchen FLUoxetine (PROZAC) 40 MG capsule Take 1 capsule (40 mg total) by mouth every morning. 90 capsule 1  . gabapentin (NEURONTIN) 300 MG capsule TAKE 1 CAPSULE(300 MG) BY MOUTH TWICE DAILY 180 capsule 1  . norethindrone-ethinyl estradiol-iron (ESTROSTEP FE) 1-20/1-30/1-35 MG-MCG tablet Take 1 tablet by mouth daily. 28 tablet 11   No current facility-administered medications for this visit.     Psychiatric Specialty Exam: Review of Systems  All other systems reviewed and are negative.   There were no vitals taken for this visit.There is no height or weight on file to calculate BMI.  General Appearance: NA  Eye Contact:  NA  Speech:  Clear and Coherent and Normal Rate  Volume:  Normal  Mood:  Euthymic  Affect:  NA  Thought Process:  Goal Directed  Orientation:  Full (Time, Place, and Person)  Thought Content: Logical   Suicidal Thoughts:  No  Homicidal Thoughts:  No  Memory:  Immediate;   Good Recent;   Good Remote;   Good  Judgement:  Good  Insight:  Good  Psychomotor Activity:   NA  Concentration:  Concentration: Fair  Recall:  Good  Fund of Knowledge: Good  Language: Good  Akathisia:  Negative  Handed:  Right  AIMS (if indicated): not done  Assets:  Communication Skills Desire for Improvement Financial Resources/Insurance Housing Talents/Skills  ADL's:  Intact  Cognition: WNL  Sleep:  Good   Screenings: GAD-7   Flowsheet Row Office Visit from 10/10/2020 in [facility] [place] Office Visit from 01/29/2019 in [facility] from 12/13/2017 in [facility]  Total GAD-7 Score 17 16 13     PHQ2-9   Flowsheet Row Office Visit from 10/10/2020 in [facility] [place] Office Visit from 01[facility]29/2019 in [facility] from 12/13/2017 in [facility]  PHQ-2 Total Score 3 6 6   PHQ-9 Total Score 18 25 14        Assessment and Plan: 19yo single female with long hx ofpolysubstance abusewho is now in sustained remission from alcohol/cannabis/ alprazolam and on Suboxone maintenance for opioid (heroin) addiction. She has a hx of anxiety (genalized, panic, social type), depression with two OD attemptsvery likelybipolar 2 disorder (depression).Several med trials not very meaningfull as she was abusing drugs at the same time. Shehas been to residential substance abuse rehab in [place]([facility])and has been sober forover 13 months after completion of the program. Her medications have been adjusted while in rehab: she is on 40 mg of fluoxetine, 300 mg of Seroquel XR, 300 mg bid of gabapentin andSuboxone 8/2 mg bid prescribed by a clinic. She no longer takespropranolol 20 mg tid prn anxiety. Her mood is stable, sleep and appetite good.No SI, no psychosis.She is busy working and with school.  Dx: Bipolar 2 disorder; GAD;Opioid use disorder in sustained  remission on maintenance therapy.  Plan:Continuefluoxetine40 mg daily, gabapentin 300 mg bid, changeSeroquelto 100 mgat HS. Next med management visit in threemonths with a new provider, patient informed that she will have to look for new provider since the clinic does not have  A physician to take over Dr.Pucilowska's patients.  She will be receiving a letter from the clinic with options for doctors.  The plan was discussed with patient who had an opportunity to ask questions and these were all answered. I spend34minutes inphone consultation with the patient.   , MD 04/21/2021, 1:10 PM

## 2021-04-30 ENCOUNTER — Telehealth: Payer: Self-pay | Admitting: Family Medicine

## 2021-04-30 NOTE — Telephone Encounter (Signed)
Pt is calling in needing a refill on her birth control and only have enough for this month she has lost her other ones and would like to see if she could get a refill on it.  Pharm:  [facility] on [address].  Pt would like to have a call back once it has been called in.

## 2021-05-07 ENCOUNTER — Telehealth: Payer: Self-pay | Admitting: Family Medicine

## 2021-05-07 NOTE — Telephone Encounter (Signed)
Called pharmacy, confirmed refills are there. Spoke with patient, informed her her refills are at the pharmacy.

## 2021-05-07 NOTE — Telephone Encounter (Signed)
Patient is calling and requesting a refill for norethindrone-ethinyl estradiol-iron (ESTROSTEP FE) 1-20/1-30/1-35 MG-MCG tablet to be sent to   [id number] Ginette Otto, [place] - [address] AT [facility] [address]  [address] [place] [address]  Phone:  [phone]  Fax:  [phone]  CB is [phone]

## 2021-05-14 NOTE — Telephone Encounter (Signed)
Spoke with pt, is aware. 

## 2021-06-15 ENCOUNTER — Other Ambulatory Visit (HOSPITAL_COMMUNITY): Payer: Self-pay | Admitting: Psychiatry

## 2021-06-16 ENCOUNTER — Other Ambulatory Visit: Payer: Self-pay

## 2021-06-17 ENCOUNTER — Ambulatory Visit: Payer: Commercial Managed Care - PPO | Admitting: Family Medicine

## 2021-06-17 ENCOUNTER — Encounter: Payer: Self-pay | Admitting: Family Medicine

## 2021-06-17 ENCOUNTER — Telehealth (INDEPENDENT_AMBULATORY_CARE_PROVIDER_SITE_OTHER): Payer: Commercial Managed Care - PPO | Admitting: Family Medicine

## 2021-06-17 DIAGNOSIS — F1121 Opioid dependence, in remission: Secondary | ICD-10-CM | POA: Diagnosis not present

## 2021-06-17 DIAGNOSIS — F1221 Cannabis dependence, in remission: Secondary | ICD-10-CM

## 2021-06-17 DIAGNOSIS — F411 Generalized anxiety disorder: Secondary | ICD-10-CM | POA: Diagnosis not present

## 2021-06-17 DIAGNOSIS — F3181 Bipolar II disorder: Secondary | ICD-10-CM | POA: Diagnosis not present

## 2021-06-17 DIAGNOSIS — Z1159 Encounter for screening for other viral diseases: Secondary | ICD-10-CM

## 2021-06-17 NOTE — Progress Notes (Signed)
Virtual Visit via Video Note  I connected with Lori Hull on 06/17/21 at  1:00 PM EDT by a video enabled telemedicine application 2/2 COVID-19 pandemic and verified that I am speaking with the correct person using two identifiers.  Location patient: home Location provider:work or home office Persons participating in the virtual visit: patient, provider  I discussed the limitations of evaluation and management by telemedicine and the availability of in person appointments. The patient expressed understanding and agreed to proceed.   HPI: Pt states she is doing well.  States has to speak quickly as her phone is about to die and she does not have her Consulting civil engineer.  Patient previously seen by psychiatrist, Dr. Jennet Maduro, who is no longer at [facility].  Patient concerned about obtaining refills next month on Seroquel, gabapentin, and fluoxetine.  Also taking Suboxone.  Stable on meds.  Looking for a new psychiatrist, but states appointments are not available for several months.  Pt wants to have Hep C screening done.    ROS: See pertinent positives and negatives per HPI.  Past Medical History:  Diagnosis Date   Allergy    Anxiety    Depression    Heart murmur    Protein deficiency (HCC)    Seizures (HCC)     Past Surgical History:  Procedure Laterality Date   ENDARTERECTOMY FEMORAL Left 10/01/2019   Procedure: Left Femoral Endarterectomy with Patch Angioplasty;  Surgeon: Sherren Kerns, MD;  [facility] OR;  Service: Vascular;  Laterality: Left;   THORACIC AORTIC ENDOVASCULAR STENT GRAFT N/A 10/01/2019   Procedure: THORACIC AORTIC ENDOVASCULAR STENT GRAFT;  Surgeon: Sherren Kerns, MD;  Location: [facility] OR;  Service: Vascular;  Laterality: N/A;    Family History  Problem Relation Age of Onset   Anxiety disorder Mother    Cancer Mother        breast   Anxiety disorder Brother    Drug abuse Brother    Alcohol abuse Paternal Grandfather    Anxiety disorder Cousin    Heart attack Father     Current Outpatient Medications:    buprenorphine-naloxone (SUBOXONE) 8-2 mg SUBL SL tablet, Place 1 tablet under the tongue 3 (three) times daily., Disp: , Rfl:    FLUoxetine (PROZAC) 40 MG capsule, Take 1 capsule (40 mg total) by mouth every morning., Disp: 90 capsule, Rfl: 1   gabapentin (NEURONTIN) 300 MG capsule, TAKE 1 CAPSULE(300 MG) BY MOUTH TWICE DAILY, Disp: 180 capsule, Rfl: 1   norethindrone-ethinyl estradiol-iron (ESTROSTEP FE) 1-20/1-30/1-35 MG-MCG tablet, Take 1 tablet by mouth daily., Disp: 28 tablet, Rfl: 11   QUEtiapine (SEROQUEL) 100 MG tablet, Take 1 tablet (100 mg total) by mouth at bedtime., Disp: 30 tablet, Rfl: 2   acetaminophen (TYLENOL) 500 MG tablet, Take 2 tablets (1,000 mg total) by mouth every 6 (six) hours as needed., Disp: 30 tablet, Rfl: 0  EXAM:  VITALS per patient if applicable:  GENERAL: alert, oriented, appears well and in no acute distress  HEENT: atraumatic, conjunctiva clear, no obvious abnormalities on inspection of external nose and ears  NECK: normal movements of the head and neck  LUNGS: on inspection no signs of respiratory distress, breathing rate appears normal, no obvious gross SOB, gasping or wheezing  CV: no obvious cyanosis  MS: moves all visible extremities without noticeable abnormality  PSYCH/NEURO: pleasant and cooperative, no obvious depression or anxiety, speech and thought processing grossly intact  ASSESSMENT AND PLAN:  Discussed the following assessment and plan:  Need for hepatitis C  screening test  Opioid use disorder, moderate, in sustained remission, on maintenance therapy (HCC)  Cannabis use disorder, moderate, in sustained remission (HCC)  Bipolar 2 disorder (HCC)  GAD (generalized anxiety disorder)  Patient stable and currently in remission.  Advised to contact [facility] clinic regarding refills if unable to schedule appointment with a new provider, as last seen 04/15/2021 by Dr. Georges Mouse.  Continue fluoxetine 40  mg daily, gabapentin 300 mg twice daily, and Seroquel 100 mg nightly.   Continue suboxone 8/2 mg BID, prescribed elsewhere.  Hep C lab ordered per pt request.  F/u prn    I discussed the assessment and treatment plan with the patient. The patient was provided an opportunity to ask questions and all were answered. The patient agreed with the plan and demonstrated an understanding of the instructions.   The patient was advised to call back or seek an in-person evaluation if the symptoms worsen or if the condition fails to improve as anticipated.   Deeann Saint, MD

## 2021-07-07 ENCOUNTER — Telehealth (HOSPITAL_COMMUNITY): Payer: Self-pay

## 2021-07-07 DIAGNOSIS — F3181 Bipolar II disorder: Secondary | ICD-10-CM

## 2021-07-07 DIAGNOSIS — F411 Generalized anxiety disorder: Secondary | ICD-10-CM

## 2021-07-07 DIAGNOSIS — R519 Headache, unspecified: Secondary | ICD-10-CM

## 2021-07-07 NOTE — Telephone Encounter (Addendum)
This patient is a former patient of Dr. Stanton Kidney. She received her certified letter but did not get a 30 day bridge supply on her medications which are Fluoxetine 40mg , Gabapentin 300mg , and Quetiapine 100mg . She's using [facility] on [address]/[place]. She's in the process of finding a new provider. Please review and advise. Thank you  NOTIFIED PATIENT

## 2021-07-07 NOTE — Telephone Encounter (Deleted)
This patient is a former patient of Dr. Stanton Kidney. She received her certified letter but did not get a 30 day bridge supply on her medications which are Fluoxetine 40mg , Gabapentin 300mg ,

## 2021-07-08 MED ORDER — FLUOXETINE HCL 40 MG PO CAPS: 40.0000 mg | ORAL_CAPSULE | Freq: Every morning | ORAL | 0 refills | Status: DC

## 2021-07-08 MED ORDER — GABAPENTIN 300 MG PO CAPS: 300.0000 mg | ORAL_CAPSULE | Freq: Two times a day (BID) | ORAL | 0 refills | Status: DC

## 2021-07-08 MED ORDER — QUETIAPINE FUMARATE 100 MG PO TABS: 100.0000 mg | ORAL_TABLET | Freq: Every day | ORAL | 0 refills | Status: DC

## 2021-07-08 NOTE — Telephone Encounter (Signed)
Thirty days bridge supply given by Covering MD. Patient must established care with new provider.  

## 2021-08-04 ENCOUNTER — Other Ambulatory Visit (HOSPITAL_COMMUNITY): Payer: Self-pay | Admitting: Psychiatry

## 2021-08-04 DIAGNOSIS — F411 Generalized anxiety disorder: Secondary | ICD-10-CM

## 2021-08-04 DIAGNOSIS — F3181 Bipolar II disorder: Secondary | ICD-10-CM

## 2021-10-27 ENCOUNTER — Other Ambulatory Visit: Payer: Self-pay | Admitting: Family Medicine

## 2021-10-27 DIAGNOSIS — Z30011 Encounter for initial prescription of contraceptive pills: Secondary | ICD-10-CM

## 2021-10-28 ENCOUNTER — Telehealth: Payer: Self-pay

## 2021-10-28 NOTE — Telephone Encounter (Signed)
Patient calling in with respiratory symptoms: Shortness of breath, chest pain, palpitations or other red words send to Triage  Does the patient have a fever over 100, cough, congestion, sore throat, runny nose, lost of taste/smell within the last 5 days (please list symptoms that patient has)? Yes Cough, congestion  Have you tested for Covid in the last 5 days? No   If yes, was it positive []  OR negative [] ? If positive in the last 5 days, please schedule virtual visit now. If negative, schedule for an in person OV with the next available provider if PCP has no openings. Please also let patient know they will be tested again (follow the script below)  "you will have to arrive prior to your appt time to be Covid tested. Please park in back of office at the cone & call [phone] to let the staff know you have arrived. A staff member will meet you at your car to do a rapid covid test. Once the test has resulted you will be notified by phone of your results to determine if appt will remain an in person visit or be converted to a virtual/phone visit. If you arrive less than before your appt time, your visit will be automatically converted to virtual & any recommended testing will happen AFTER the visit."   THINGS TO REMEMBER  If no availability for virtual visit in office,  please schedule another [place] office  If no availability at another [place] office, please instruct patient that they can schedule an evisit or virtual visit through their mychart account. Visits up to 8pm  patients can be seen in office 5 days after positive COVID test

## 2021-10-30 ENCOUNTER — Ambulatory Visit: Payer: Commercial Managed Care - PPO | Admitting: Family Medicine

## 2021-11-02 ENCOUNTER — Encounter: Payer: Self-pay | Admitting: Family Medicine

## 2021-11-02 ENCOUNTER — Ambulatory Visit (INDEPENDENT_AMBULATORY_CARE_PROVIDER_SITE_OTHER): Payer: Commercial Managed Care - PPO | Admitting: Family Medicine

## 2021-11-02 VITALS — BP 124/76 | HR 88 | Temp 97.9°F | Wt 163.8 lb

## 2021-11-02 DIAGNOSIS — Z1159 Encounter for screening for other viral diseases: Secondary | ICD-10-CM | POA: Diagnosis not present

## 2021-11-02 DIAGNOSIS — Z23 Encounter for immunization: Secondary | ICD-10-CM

## 2021-11-02 DIAGNOSIS — R1013 Epigastric pain: Secondary | ICD-10-CM | POA: Diagnosis not present

## 2021-11-02 DIAGNOSIS — Z113 Encounter for screening for infections with a predominantly sexual mode of transmission: Secondary | ICD-10-CM

## 2021-11-02 LAB — CBC WITH DIFFERENTIAL/PLATELET
Basophils Absolute: 0 10*3/uL (ref 0.0–0.1)
Basophils Relative: 0.5 % (ref 0.0–3.0)
Eosinophils Absolute: 0.4 10*3/uL (ref 0.0–0.7)
Eosinophils Relative: 5 % (ref 0.0–5.0)
HCT: 40.5 % (ref 36.0–46.0)
Hemoglobin: 13.7 g/dL (ref 12.0–15.0)
Lymphocytes Relative: 39.1 % (ref 12.0–46.0)
Lymphs Abs: 2.9 10*3/uL (ref 0.7–4.0)
MCHC: 33.7 g/dL (ref 30.0–36.0)
MCV: 91 fl (ref 78.0–100.0)
Monocytes Absolute: 0.5 10*3/uL (ref 0.1–1.0)
Monocytes Relative: 6 % (ref 3.0–12.0)
Neutro Abs: 3.7 10*3/uL (ref 1.4–7.7)
Neutrophils Relative %: 49.4 % (ref 43.0–77.0)
Platelets: 293 10*3/uL (ref 150.0–400.0)
RBC: 4.45 Mil/uL (ref 3.87–5.11)
RDW: 13.4 % (ref 11.5–15.5)
WBC: 7.5 10*3/uL (ref 4.0–10.5)

## 2021-11-02 LAB — COMPREHENSIVE METABOLIC PANEL
ALT: 10 U/L (ref 0–35)
AST: 12 U/L (ref 0–37)
Albumin: 4.4 g/dL (ref 3.5–5.2)
Alkaline Phosphatase: 62 U/L (ref 39–117)
BUN: 9 mg/dL (ref 6–23)
CO2: 28 mEq/L (ref 19–32)
Calcium: 10 mg/dL (ref 8.4–10.5)
Chloride: 101 mEq/L (ref 96–112)
Creatinine, Ser: 0.77 mg/dL (ref 0.40–1.20)
GFR: 110.23 mL/min (ref >60.00)
Glucose, Bld: 89 mg/dL (ref 70–99)
Potassium: 3.8 mEq/L (ref 3.5–5.1)
Sodium: 138 mEq/L (ref 135–145)
Total Bilirubin: 0.4 mg/dL (ref 0.2–1.2)
Total Protein: 7.6 g/dL (ref 6.0–8.3)

## 2021-11-02 LAB — GAMMA GT: GGT: 20 U/L (ref 7–51)

## 2021-11-02 LAB — LIPASE: Lipase: 14 U/L (ref 11.0–59.0)

## 2021-11-02 NOTE — Progress Notes (Signed)
Subjective:    Patient ID: Lori Hull, female    DOB: [birth year], 20 y.o.   MRN: [id number]  Chief Complaint  Patient presents with   testing    Hep C testing. Info on mold testing    HPI Patient was seen today for hep C testing.  Patient inquires if she has hep C.  Endorses epigastric pain that occurs intermittently when standing at work.  Patient denies association with food, N/V, constipation, diarrhea.  Patient endorses history of acid reflux.  May take Tums or Alka-Seltzer symptoms are bad.  Patient states in the past she thinks she had the antibody for hep C.  Inquires about influenza vaccine.  Past Medical History:  Diagnosis Date   Allergy    Anxiety    Depression    Heart murmur    Protein deficiency (HCC)    Seizures (HCC)     No Known Allergies  ROS General: Denies fever, chills, night sweats, changes in weight, changes in appetite HEENT: Denies headaches, ear pain, changes in vision, rhinorrhea, sore throat CV: Denies CP, palpitations, SOB, orthopnea Pulm: Denies SOB, cough, wheezing GI: Denies nausea, vomiting, diarrhea, constipation   +epigastric abd pain GU: Denies dysuria, hematuria, frequency Msk: Denies muscle cramps, joint pains Neuro: Denies weakness, numbness, tingling Skin: Denies rashes, bruising Psych: Denies depression, anxiety, hallucinations     Objective:    Blood pressure 124/76, pulse 88, temperature 97.9 F (36.6 C), temperature source Oral, weight 163 lb 12.8 oz (74.3 kg), SpO2 97 %.  Gen. Pleasant, well-nourished, in no distress, normal affect   HEENT: Bent/AT, face symmetric, conjunctiva clear, no scleral icterus, PERRLA, EOMI, nares patent without drainage. Audible nasal drainage. Lungs: no accessory muscle use Cardiovascular: RRR, no m/r/g, no peripheral edema Abdomen: BS present, soft, NT/ND, no hepatosplenomegaly. Musculoskeletal: No deformities, no cyanosis or clubbing, normal tone Neuro:  A&Ox3, CN II-XII intact, normal  gait Skin:  Warm, no lesions/ rash   Wt Readings from Last 3 Encounters:  11/02/21 163 lb 12.8 oz (74.3 kg)  01/12/21 157 lb 3.2 oz (71.3 kg)  10/10/20 157 lb (71.2 kg)    Lab Results  Component Value Date   WBC 5.1 10/03/2019   HGB 10.4 (L) 10/03/2019   HCT 31.5 (L) 10/03/2019   PLT 189 10/03/2019   GLUCOSE 93 10/03/2019   ALT 70 (H) 10/01/2019   AST 140 (H) 10/01/2019   NA 140 10/03/2019   K 3.7 10/03/2019   CL 110 10/03/2019   CREATININE 0.65 10/03/2019   BUN 7 10/03/2019   CO2 22 10/03/2019   INR 0.99 05/14/2017    Assessment/Plan:  Epigastric pain -Discussed possible causes including GERD, gallbladder etiology, ulcer, pancreatitis, etc -will obtain labs -continue Tums prn.  Consider PPI  - Plan: Hep C Antibody, CBC with Differential/Platelet, CMP, Lipase, Gamma GT  Need for influenza vaccination - Plan: Flu Vaccine QUAD 6+ mos PF IM (Fluarix Quad PF)  Need for hepatitis C screening test  - Plan: Hep C Antibody  Routine screening for STI (sexually transmitted infection)  - Plan: HIV Antibody (routine testing w rflx)  F/u as needed  Abbe Amsterdam, MD

## 2021-11-03 LAB — HIV ANTIBODY (ROUTINE TESTING W REFLEX): HIV 1&2 Ab, 4th Generation: NONREACTIVE

## 2021-11-04 ENCOUNTER — Other Ambulatory Visit: Payer: Self-pay | Admitting: Family Medicine

## 2021-11-04 DIAGNOSIS — R768 Other specified abnormal immunological findings in serum: Secondary | ICD-10-CM | POA: Insufficient documentation

## 2021-11-07 LAB — HEPATITIS C ANTIBODY
Hepatitis C Ab: REACTIVE — AB
SIGNAL TO CUT-OFF: 5.09 — ABNORMAL HIGH (ref <1.00)

## 2021-11-07 LAB — HCV RNA,QUANTITATIVE REAL TIME PCR
HCV Quantitative Log: <1.18 Log IU/mL
HCV RNA, PCR, QN: <15 IU/mL

## 2021-11-11 ENCOUNTER — Other Ambulatory Visit: Payer: Self-pay

## 2021-11-11 DIAGNOSIS — S2509XD Other specified injury of thoracic aorta, subsequent encounter: Secondary | ICD-10-CM

## 2021-11-17 ENCOUNTER — Other Ambulatory Visit: Payer: Self-pay

## 2021-11-17 DIAGNOSIS — S2509XD Other specified injury of thoracic aorta, subsequent encounter: Secondary | ICD-10-CM

## 2021-11-26 ENCOUNTER — Ambulatory Visit (HOSPITAL_COMMUNITY)
Admission: RE | Admit: 2021-11-26 | Discharge: 2021-11-26 | Disposition: A | Discharge status: Home / Self Care | Payer: Commercial Managed Care - PPO | Source: Ambulatory Visit | Attending: Vascular Surgery | Admitting: Vascular Surgery

## 2021-11-26 ENCOUNTER — Encounter (HOSPITAL_COMMUNITY): Payer: Self-pay

## 2021-11-26 ENCOUNTER — Other Ambulatory Visit: Payer: Self-pay

## 2021-11-26 DIAGNOSIS — S2509XD Other specified injury of thoracic aorta, subsequent encounter: Secondary | ICD-10-CM | POA: Diagnosis not present

## 2021-11-26 MED ORDER — IOHEXOL 350 MG/ML SOLN
100.0000 mL | Freq: Once | INTRAVENOUS | Status: AC | PRN
Start: 2021-11-26 — End: 2021-11-26
  Administered 2021-11-26: 100 mL via INTRAVENOUS

## 2021-11-26 MED ORDER — SODIUM CHLORIDE (PF) 0.9 % IJ SOLN
INTRAMUSCULAR | Status: AC
  Filled 2021-11-26: qty 50

## 2021-11-30 ENCOUNTER — Encounter: Payer: Self-pay | Admitting: Family Medicine

## 2021-12-03 ENCOUNTER — Ambulatory Visit: Payer: Commercial Managed Care - PPO | Admitting: Vascular Surgery

## 2021-12-10 ENCOUNTER — Ambulatory Visit: Payer: Commercial Managed Care - PPO | Admitting: Vascular Surgery

## 2021-12-24 ENCOUNTER — Encounter: Payer: Self-pay | Admitting: Vascular Surgery

## 2021-12-24 ENCOUNTER — Other Ambulatory Visit: Payer: Self-pay

## 2021-12-24 ENCOUNTER — Ambulatory Visit (INDEPENDENT_AMBULATORY_CARE_PROVIDER_SITE_OTHER): Payer: Commercial Managed Care - PPO | Admitting: Vascular Surgery

## 2021-12-24 VITALS — BP 119/82 | HR 80 | Temp 97.9°F | Resp 20 | Ht 68.0 in | Wt 155.0 lb

## 2021-12-24 DIAGNOSIS — S2509XD Other specified injury of thoracic aorta, subsequent encounter: Secondary | ICD-10-CM
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
## 2021-12-25 ENCOUNTER — Other Ambulatory Visit: Payer: Self-pay | Admitting: Family Medicine

## 2021-12-25 DIAGNOSIS — Z30011 Encounter for initial prescription of contraceptive pills: Secondary | ICD-10-CM

## 2022-03-05 ENCOUNTER — Ambulatory Visit: Payer: Commercial Managed Care - PPO | Admitting: Family Medicine

## 2022-03-11 ENCOUNTER — Other Ambulatory Visit (HOSPITAL_COMMUNITY)
Admission: RE | Admit: 2022-03-11 | Discharge: 2022-03-11 | Disposition: A | Discharge status: Home / Self Care | Payer: Commercial Managed Care - PPO | Source: Ambulatory Visit | Attending: Family Medicine | Admitting: Family Medicine

## 2022-03-11 ENCOUNTER — Encounter: Payer: Self-pay | Admitting: Family Medicine

## 2022-03-11 ENCOUNTER — Ambulatory Visit (INDEPENDENT_AMBULATORY_CARE_PROVIDER_SITE_OTHER): Payer: Commercial Managed Care - PPO | Admitting: Family Medicine

## 2022-03-11 ENCOUNTER — Telehealth: Payer: Commercial Managed Care - PPO | Admitting: Family Medicine

## 2022-03-11 VITALS — BP 130/76 | HR 104 | Temp 98.1°F | Wt 131.4 lb

## 2022-03-11 DIAGNOSIS — Z113 Encounter for screening for infections with a predominantly sexual mode of transmission: Secondary | ICD-10-CM | POA: Diagnosis present

## 2022-03-11 DIAGNOSIS — Z3041 Encounter for surveillance of contraceptive pills: Secondary | ICD-10-CM

## 2022-03-11 DIAGNOSIS — R768 Other specified abnormal immunological findings in serum: Secondary | ICD-10-CM

## 2022-03-11 DIAGNOSIS — R300 Dysuria: Secondary | ICD-10-CM

## 2022-03-11 LAB — POCT URINALYSIS DIPSTICK
Glucose, UA: NEGATIVE
Ketones, UA: NEGATIVE
Leukocytes, UA: NEGATIVE
Nitrite, UA: NEGATIVE
Protein, UA: POSITIVE — AB
Spec Grav, UA: >=1.03 — AB (ref 1.010–1.025)
Urobilinogen, UA: NEGATIVE E.U./dL — AB
pH, UA: 6 (ref 5.0–8.0)

## 2022-03-11 MED ORDER — NORETHINDRON-ETHINYL ESTRAD-FE 1-20/1-30/1-35 MG-MCG PO TABS: 1.0000 | ORAL_TABLET | Freq: Every day | ORAL | 5 refills | Status: DC

## 2022-03-11 NOTE — Addendum Note (Signed)
Addended by: Elwin Mocha on: 03/11/2022 03:05 PM ? ? Modules accepted: Orders ? ?

## 2022-03-11 NOTE — Patient Instructions (Signed)
A new referral was placed to infectious disease.  If you have not heard anything about this referral in the next week please notify the office. ?

## 2022-03-11 NOTE — Progress Notes (Signed)
Subjective:  ? ? Patient ID: Lori Hull, female    DOB: 03-Jan-2001, 21 y.o.   MRN: [id number] ? ?Chief Complaint  ?Patient presents with  ? Exposure to STD  ?  Screening for STD, HIV. HPV ?Had some discharge, may have been uti, but wants to be safe.  ? ? ?HPI ?Patient was seen today for acute concern.  Pt with some dysuria and lower abd pain.  Was unsure if having UTI symptoms as on menses.  Pt also inquires about STI screening.  Plans on scheduling pap with OB/Gyn.  Requesting refill on OCPs.  Previously stopped taking them. ? ?Pt inquires about referral to GI for hep C treatment.  States the referrals placed went to offices that do not treat hep c. ? ?Past Medical History:  ?Diagnosis Date  ? Allergy   ? Anxiety   ? Depression   ? Heart murmur   ? Hepatitis C   ? Protein deficiency (HCC)   ? Seizures (HCC)   ? ? ?No Known Allergies ? ?ROS ?General: Denies fever, chills, night sweats, changes in weight, changes in appetite ?HEENT: Denies headaches, ear pain, changes in vision, rhinorrhea, sore throat ?CV: Denies CP, palpitations, SOB, orthopnea ?Pulm: Denies SOB, cough, wheezing ?GI: Denies nausea, vomiting, diarrhea, constipation + lower abdominal pain ?GU: Denies hematuria, frequency, vaginal discharge +dysuria ?Msk: Denies muscle cramps, joint pains ?Neuro: Denies weakness, numbness, tingling ?Skin: Denies rashes, bruising ?Psych: Denies depression, anxiety, hallucinations ? ?  ?Objective:  ?  ?Blood pressure 130/76, pulse (!) 104, temperature 98.1 ?F (36.7 ?C), temperature source Oral, weight 131 lb 6.4 oz (59.6 kg), SpO2 100 %. ? ?Gen. Pleasant, well-nourished, in no distress, normal affect   ?HEENT: [place]/AT, face symmetric, conjunctiva clear, no scleral icterus, PERRLA, EOMI, nares patent without drainage ?Lungs: no accessory muscle use ?Cardiovascular: RRR, no peripheral edema ?Neuro:  A&Ox3, CN II-XII intact, normal gait ?Skin:  Warm, no lesions/ rash ? ?Wt Readings from Last 3 Encounters:  ?03/11/22 131  lb 6.4 oz (59.6 kg)  ?12/24/21 155 lb (70.3 kg)  ?11/02/21 163 lb 12.8 oz (74.3 kg)  ? ? ?Lab Results  ?Component Value Date  ? WBC 7.5 11/02/2021  ? HGB 13.7 11/02/2021  ? HCT 40.5 11/02/2021  ? PLT 293.0 11/02/2021  ? GLUCOSE 89 11/02/2021  ? ALT 10 11/02/2021  ? AST 12 11/02/2021  ? NA 138 11/02/2021  ? K 3.8 11/02/2021  ? CL 101 11/02/2021  ? CREATININE 0.77 11/02/2021  ? BUN 9 11/02/2021  ? CO2 28 11/02/2021  ? INR 0.99 05/14/2017  ? ? ?Assessment/Plan: ? ?Dysuria ?-POC UA with 2+ RBCs, protein, and SG >1.030 ?-2+ RBCs likely 2/2 current menses ?-will obtain Ucx to r/o UTI ?-Increase p.o. intake of water ? - Plan: POCT urinalysis dipstick ? ?Routine screening for STI (sexually transmitted infection)  ?- Plan: HIV Antibody (routine testing w rflx), RPR, Cervicovaginal ancillary only ? ?Hepatitis C antibody test positive  ?-new referral placed ?- Plan: Ambulatory referral to Infectious Disease ? ?Encounter for surveillance of contraceptive pills ?-Plan: Estrostep Fe ? ?F/u prn ? ?Abbe Amsterdam, MD ?

## 2022-03-12 LAB — URINE CULTURE
MICRO NUMBER:: 13290497
SPECIMEN QUALITY:: ADEQUATE

## 2022-03-12 LAB — HIV ANTIBODY (ROUTINE TESTING W REFLEX): HIV 1&2 Ab, 4th Generation: NONREACTIVE

## 2022-03-12 LAB — RPR: RPR Ser Ql: NONREACTIVE

## 2022-03-15 ENCOUNTER — Other Ambulatory Visit: Payer: Self-pay | Admitting: Family Medicine

## 2022-03-15 DIAGNOSIS — B9689 Other specified bacterial agents as the cause of diseases classified elsewhere: Secondary | ICD-10-CM

## 2022-03-15 DIAGNOSIS — A749 Chlamydial infection, unspecified: Secondary | ICD-10-CM

## 2022-03-15 LAB — CERVICOVAGINAL ANCILLARY ONLY
Bacterial Vaginitis (gardnerella): POSITIVE — AB
Candida Glabrata: NEGATIVE
Candida Vaginitis: NEGATIVE
Chlamydia: POSITIVE — AB
Comment: NEGATIVE
Comment: NEGATIVE
Comment: NEGATIVE
Comment: NEGATIVE
Comment: NEGATIVE
Comment: NORMAL
Neisseria Gonorrhea: NEGATIVE
Trichomonas: NEGATIVE

## 2022-03-15 MED ORDER — METRONIDAZOLE 500 MG PO TABS: 500.0000 mg | ORAL_TABLET | Freq: Two times a day (BID) | ORAL | 0 refills | Status: AC

## 2022-03-15 MED ORDER — DOXYCYCLINE HYCLATE 100 MG PO TABS: 100.0000 mg | ORAL_TABLET | Freq: Two times a day (BID) | ORAL | 0 refills | Status: AC

## 2022-03-15 NOTE — Progress Notes (Signed)
Rx for Flagyl sent to treat BV.  Rx for doxycycline sent for chlamydial infection as it is now the treatment of choice compared to single dose azithromycin due to superior microbial efficacy. ?

## 2022-03-22 ENCOUNTER — Other Ambulatory Visit (HOSPITAL_COMMUNITY): Payer: Self-pay

## 2022-03-22 ENCOUNTER — Telehealth: Payer: Self-pay

## 2022-03-22 NOTE — Telephone Encounter (Signed)
[facility] Patient Advocate Encounter ? ?Insurance verification completed.   ? ?The patient is insured through [place] . ? ?Medication will need a PA . ?Prescription will need to be filled with [facility] . ?Phone # [phone] ?Fax # [phone] ? ?We will continue to follow to see if copay assistance is needed. ? ?Ileene Patrick, CPhT ?Specialty Pharmacy Patient Advocate ?[facility] ?Phone: [phone] ?Fax:  [phone]  ?

## 2022-03-24 ENCOUNTER — Encounter: Payer: Self-pay | Admitting: Infectious Disease

## 2022-03-24 ENCOUNTER — Other Ambulatory Visit: Payer: Self-pay

## 2022-03-24 ENCOUNTER — Ambulatory Visit (INDEPENDENT_AMBULATORY_CARE_PROVIDER_SITE_OTHER): Payer: Commercial Managed Care - PPO | Admitting: Infectious Disease

## 2022-03-24 VITALS — BP 131/87 | HR 100 | Temp 98.3°F | Wt 133.0 lb

## 2022-03-24 DIAGNOSIS — Z113 Encounter for screening for infections with a predominantly sexual mode of transmission: Secondary | ICD-10-CM

## 2022-03-24 DIAGNOSIS — F1121 Opioid dependence, in remission: Secondary | ICD-10-CM

## 2022-03-24 DIAGNOSIS — R768 Other specified abnormal immunological findings in serum: Secondary | ICD-10-CM | POA: Diagnosis not present

## 2022-03-24 DIAGNOSIS — Z7189 Other specified counseling: Secondary | ICD-10-CM

## 2022-03-24 DIAGNOSIS — F3181 Bipolar II disorder: Secondary | ICD-10-CM | POA: Diagnosis not present
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
## 2022-03-25 ENCOUNTER — Other Ambulatory Visit: Payer: Self-pay | Admitting: Pharmacist

## 2022-03-25 DIAGNOSIS — Z114 Encounter for screening for human immunodeficiency virus [HIV]: Secondary | ICD-10-CM

## 2022-03-25 MED ORDER — DESCOVY 200-25 MG PO TABS: 1.0000 | ORAL_TABLET | Freq: Every day | ORAL | 0 refills | Status: AC

## 2022-03-26 ENCOUNTER — Telehealth: Payer: Self-pay | Admitting: Pharmacist

## 2022-03-26 LAB — HEPATITIS C RNA QUANTITATIVE
HCV Quantitative Log: <1.18 log IU/mL
HCV RNA, PCR, QN: <15 IU/mL

## 2022-03-26 LAB — HEPATITIS B SURFACE ANTIBODY, QUANTITATIVE: Hep B S AB Quant (Post): <5 m[IU]/mL — ABNORMAL LOW (ref >=10)

## 2022-03-26 LAB — HEPATITIS B SURFACE ANTIGEN: Hepatitis B Surface Ag: NONREACTIVE

## 2022-03-26 LAB — HIV ANTIBODY (ROUTINE TESTING W REFLEX): HIV 1&2 Ab, 4th Generation: NONREACTIVE

## 2022-03-26 LAB — HEPATITIS A ANTIBODY, TOTAL: Hepatitis A AB,Total: REACTIVE — AB

## 2022-03-26 NOTE — Telephone Encounter (Signed)
LVM with patient requesting callback. Will review Apretude counseling with her and assess her interest in the injections. ? ?Margarite Gouge, PharmD, CPP ?Clinical Pharmacist Practitioner ?Infectious Diseases Clinical Pharmacist ?[facility] ? ?

## 2022-04-05 ENCOUNTER — Encounter: Payer: Self-pay | Admitting: Family Medicine

## 2022-04-05 ENCOUNTER — Telehealth (INDEPENDENT_AMBULATORY_CARE_PROVIDER_SITE_OTHER): Payer: Commercial Managed Care - PPO | Admitting: Family Medicine

## 2022-04-05 VITALS — Ht 68.0 in

## 2022-04-05 DIAGNOSIS — N760 Acute vaginitis: Secondary | ICD-10-CM

## 2022-04-05 DIAGNOSIS — A749 Chlamydial infection, unspecified: Secondary | ICD-10-CM | POA: Diagnosis not present

## 2022-04-05 DIAGNOSIS — B9689 Other specified bacterial agents as the cause of diseases classified elsewhere: Secondary | ICD-10-CM | POA: Diagnosis not present

## 2022-04-05 MED ORDER — DOXYCYCLINE HYCLATE 100 MG PO TABS: 100.0000 mg | ORAL_TABLET | Freq: Two times a day (BID) | ORAL | 0 refills | Status: AC

## 2022-04-05 MED ORDER — METRONIDAZOLE 500 MG PO TABS: 500.0000 mg | ORAL_TABLET | Freq: Two times a day (BID) | ORAL | 0 refills | Status: AC

## 2022-04-05 NOTE — Progress Notes (Signed)
Virtual Visit via Video Note ? ?I connected with Lori Hull on 04/05/22 at  2:30 PM EDT by a video enabled telemedicine application 2/2 COVID-19 pandemic and verified that I am speaking with the correct person using two identifiers. ? Location patient: home ?Location provider:work or home office ?Persons participating in the virtual visit: patient, provider ? ?I discussed the limitations of evaluation and management by telemedicine and the availability of in person appointments. The patient expressed understanding and agreed to proceed. ? ? ?HPI: ?Pt given rx for doxycycline and flagyl for chlamydia and BV on 03/11/22, but missed a few doses and drank EtOH.  Did eventually take all the pills.  Pt states she is still having a small amount of discharge and odor as before.  Pt denies n/v, dizziness, diarrhea while taking medication. ? ? ?ROS: See pertinent positives and negatives per HPI. ? ?Past Medical History:  ?Diagnosis Date  ? Allergy   ? Anxiety   ? Depression   ? Heart murmur   ? Hepatitis C   ? Protein deficiency (HCC)   ? Seizures (HCC)   ? ? ?Past Surgical History:  ?Procedure Laterality Date  ? ENDARTERECTOMY FEMORAL Left 10/01/2019  ? Procedure: Left Femoral Endarterectomy with Patch Angioplasty;  Surgeon: Sherren Kerns, MD;  [facility] OR;  Service: Vascular;  Laterality: Left;  ? THORACIC AORTIC ENDOVASCULAR STENT GRAFT N/A 10/01/2019  ? Procedure: THORACIC AORTIC ENDOVASCULAR STENT GRAFT;  Surgeon: Sherren Kerns, MD;  Location: [facility];  Service: Vascular;  Laterality: N/A;  ? ? ?Family History  ?Problem Relation Age of Onset  ? Anxiety disorder Mother   ? Cancer Mother   ?     breast  ? Anxiety disorder Brother   ? Drug abuse Brother   ? Alcohol abuse Paternal Grandfather   ? Anxiety disorder Cousin   ? Heart attack Father   ? ? ?Current Outpatient Medications:  ?  emtricitabine-tenofovir AF (DESCOVY) 200-25 MG tablet, Take 1 tablet by mouth daily for 28 days., Disp: 28 tablet, Rfl: 0 ?   gabapentin (NEURONTIN) 300 MG capsule, Take 1 capsule (300 mg total) by mouth 2 (two) times daily. Thirty days bridge supply given by Covering MD. Patient must established care with new provider., Disp: 60 capsule, Rfl: 0 ?  lamoTRIgine (LAMICTAL) 100 MG tablet, Take 100 mg by mouth daily., Disp: , Rfl:  ?  lamoTRIgine (LAMICTAL) 25 MG tablet, Take by mouth., Disp: , Rfl:  ?  norethindrone-ethinyl estradiol-iron (ESTROSTEP FE) 1-20/1-30/1-35 MG-MCG tablet, Take 1 tablet by mouth daily., Disp: 28 tablet, Rfl: 5 ?  QUEtiapine (SEROQUEL) 100 MG tablet, Take 1 tablet (100 mg total) by mouth at bedtime., Disp: 30 tablet, Rfl: 0 ?  buprenorphine-naloxone (SUBOXONE) 8-2 mg SUBL SL tablet, Place 1 tablet under the tongue 3 (three) times daily. (Patient not taking: Reported on 12/24/2021), Disp: , Rfl:  ?  FLUoxetine (PROZAC) 40 MG capsule, Take 1 capsule (40 mg total) by mouth every morning., Disp: 30 capsule, Rfl: 0 ? ?EXAM: ? ?VITALS per patient if applicable: RR between 12-20 bpm ? ?GENERAL: alert, oriented, appears well and in no acute distress ? ?HEENT: atraumatic, conjunctiva clear, no obvious abnormalities on inspection of external nose and ears ? ?NECK: normal movements of the head and neck ? ?LUNGS: on inspection no signs of respiratory distress, breathing rate appears normal, no obvious gross SOB, gasping or wheezing ? ?CV: no obvious cyanosis ? ?MS: moves all visible extremities without noticeable abnormality ? ?PSYCH/NEURO: pleasant  and cooperative, no obvious depression or anxiety, speech and thought processing grossly intact ? ?ASSESSMENT AND PLAN: ? ?Discussed the following assessment and plan: ? ?BV (bacterial vaginosis)  ?-Noted on 03/11/2022. ?-We will restart course of Flagyl 2/2 taking med inconsistently. ?-Advised to avoid alcohol while taking medication and to refrain from sexual activity until medication complete. ?- Plan: metroNIDAZOLE (FLAGYL) 500 MG tablet ? ?Chlamydia  ?-Positive testing on 03/11/2022  with continued milder symptoms after taking ABX inconsistently ?-We will restart doxycycline 100 mg twice daily as it is now TOC compared to single dose azithromycin due to superior microbial efficacy.  If patient unable to take ABX consistently with this round of treatment strongly consider single dose azithromycin. ?-Patient advised to refrain from sexual activity until completion of medication.  All sexual partners should be tested/treated.  Consider expedited partner therapy if needed. ?-Discussed test of cure no sooner than 4 wks after completion of medication.  Retesting for reinfection in 3 months. ?- Plan: doxycycline (VIBRA-TABS) 100 MG tablet ? ?Follow-up in 1-3 months, sooner if needed ?  ?I discussed the assessment and treatment plan with the patient. The patient was provided an opportunity to ask questions and all were answered. The patient agreed with the plan and demonstrated an understanding of the instructions. ?  ?The patient was advised to call back or seek an in-person evaluation if the symptoms worsen or if the condition fails to improve as anticipated. ? ?Deeann Saint, MD  ? ?

## 2022-04-22 ENCOUNTER — Ambulatory Visit: Payer: Commercial Managed Care - PPO | Admitting: Infectious Disease

## 2022-05-19 ENCOUNTER — Ambulatory Visit: Payer: Commercial Managed Care - PPO | Admitting: Infectious Disease

## 2022-07-19 ENCOUNTER — Ambulatory Visit (INDEPENDENT_AMBULATORY_CARE_PROVIDER_SITE_OTHER): Payer: Commercial Managed Care - PPO | Admitting: Family Medicine

## 2022-07-19 VITALS — BP 110/76 | HR 87 | Temp 98.9°F | Ht 68.5 in | Wt 123.6 lb

## 2022-07-19 DIAGNOSIS — Z0000 Encounter for general adult medical examination without abnormal findings: Secondary | ICD-10-CM | POA: Diagnosis not present

## 2022-07-19 DIAGNOSIS — Z13220 Encounter for screening for lipoid disorders: Secondary | ICD-10-CM | POA: Diagnosis not present

## 2022-07-19 DIAGNOSIS — Z113 Encounter for screening for infections with a predominantly sexual mode of transmission: Secondary | ICD-10-CM | POA: Diagnosis not present

## 2022-07-19 DIAGNOSIS — R634 Abnormal weight loss: Secondary | ICD-10-CM

## 2022-07-19 DIAGNOSIS — F1910 Other psychoactive substance abuse, uncomplicated: Secondary | ICD-10-CM | POA: Diagnosis not present

## 2022-07-19 DIAGNOSIS — N6012 Diffuse cystic mastopathy of left breast: Secondary | ICD-10-CM | POA: Diagnosis not present

## 2022-07-19 LAB — CBC WITH DIFFERENTIAL/PLATELET
Basophils Absolute: 0 10*3/uL (ref 0.0–0.1)
Basophils Relative: 0.5 % (ref 0.0–3.0)
Eosinophils Absolute: 0.1 10*3/uL (ref 0.0–0.7)
Eosinophils Relative: 2.1 % (ref 0.0–5.0)
HCT: 38.7 % (ref 36.0–46.0)
Hemoglobin: 13.1 g/dL (ref 12.0–15.0)
Lymphocytes Relative: 40.9 % (ref 12.0–46.0)
Lymphs Abs: 2.4 10*3/uL (ref 0.7–4.0)
MCHC: 33.9 g/dL (ref 30.0–36.0)
MCV: 94.6 fl (ref 78.0–100.0)
Monocytes Absolute: 0.4 10*3/uL (ref 0.1–1.0)
Monocytes Relative: 7.5 % (ref 3.0–12.0)
Neutro Abs: 2.9 10*3/uL (ref 1.4–7.7)
Neutrophils Relative %: 49 % (ref 43.0–77.0)
Platelets: 269 10*3/uL (ref 150.0–400.0)
RBC: 4.09 Mil/uL (ref 3.87–5.11)
RDW: 13 % (ref 11.5–15.5)
WBC: 5.8 10*3/uL (ref 4.0–10.5)

## 2022-07-19 LAB — COMPREHENSIVE METABOLIC PANEL
ALT: 13 U/L (ref 0–35)
AST: 14 U/L (ref 0–37)
Albumin: 4.5 g/dL (ref 3.5–5.2)
Alkaline Phosphatase: 48 U/L (ref 39–117)
BUN: 13 mg/dL (ref 6–23)
CO2: 28 mEq/L (ref 19–32)
Calcium: 9.7 mg/dL (ref 8.4–10.5)
Chloride: 101 mEq/L (ref 96–112)
Creatinine, Ser: 0.9 mg/dL (ref 0.40–1.20)
GFR: 90.96 mL/min (ref >60.00)
Glucose, Bld: 78 mg/dL (ref 70–99)
Potassium: 4 mEq/L (ref 3.5–5.1)
Sodium: 140 mEq/L (ref 135–145)
Total Bilirubin: 0.4 mg/dL (ref 0.2–1.2)
Total Protein: 7.2 g/dL (ref 6.0–8.3)

## 2022-07-19 LAB — TSH: TSH: 0.87 u[IU]/mL (ref 0.35–5.50)

## 2022-07-19 LAB — LIPID PANEL
Cholesterol: 178 mg/dL (ref 0–200)
HDL: 73.4 mg/dL (ref >39.00)
LDL Cholesterol: 92 mg/dL (ref 0–99)
NonHDL: 104.57
Total CHOL/HDL Ratio: 2
Triglycerides: 63 mg/dL (ref 0.0–149.0)
VLDL: 12.6 mg/dL (ref 0.0–40.0)

## 2022-07-19 LAB — HEMOGLOBIN A1C: Hgb A1c MFr Bld: 5.3 % (ref 4.6–6.5)

## 2022-07-19 LAB — T4, FREE: Free T4: 0.73 ng/dL (ref 0.60–1.60)

## 2022-07-19 NOTE — Progress Notes (Signed)
Subjective:     Lori Hull is a 21 y.o. female and is here for a comprehensive physical exam. The patient reports recent relapse, now clean for 1.5 wks.  Pt concerned about a lump in L breast present x a while, non painful.  Pt endorses changes in weight.  Requesting STI testing.  Social History   Socioeconomic History   Marital status: Single    Spouse name: Not on file   Number of children: Not on file   Years of education: Not on file   Highest education level: Not on file  Occupational History   Not on file  Tobacco Use   Smoking status: Every Day    Types: E-cigarettes   Smokeless tobacco: Never   Tobacco comments:    Smoking Jewel pods  Vaping Use   Vaping Use: Former  Substance and Sexual Activity   Alcohol use: Not Currently   Drug use: Not Currently    Types: Marijuana, Heroin, Other-see comments    Comment: Heroin: Last PTA, daily basis    Sexual activity: Yes    Birth control/protection: Implant  Other Topics Concern   Not on file  Social History Narrative   Not on file   Social Determinants of Health   Financial Resource Strain: Not on file  Food Insecurity: Not on file  Transportation Needs: Not on file  Physical Activity: Not on file  Stress: Stress Concern Present (02/05/2019)   [employer] of Occupational Health - Occupational Stress Questionnaire    Feeling of Stress : Very much  Social Connections: Not on file  Intimate Partner Violence: Not on file   Health Maintenance  Topic Date Due   HPV VACCINES (1 - 2-dose series) Never done   TETANUS/TDAP  Never done   COVID-19 Vaccine (3 - Pfizer series) 05/12/2020   PAP-Cervical Cytology Screening  Never done   PAP SMEAR-Modifier  Never done   INFLUENZA VACCINE  06/22/2022   CHLAMYDIA SCREENING  03/12/2023   Hepatitis C Screening  Completed   HIV Screening  Completed    The following portions of the patient's history were reviewed and updated as appropriate: allergies, current  medications, past family history, past medical history, past social history, past surgical history, and problem list.  Review of Systems Pertinent items noted in HPI and remainder of comprehensive ROS otherwise negative.   Objective:    BP 110/76 (BP Location: Right Arm, Patient Position: Sitting, Cuff Size: Normal)   Pulse 87   Temp 98.9 F (37.2 C) (Oral)   Ht 5' 8.5" (1.74 m)   Wt 123 lb 9.6 oz (56.1 kg)   LMP 06/28/2022   SpO2 98%   BMI 18.52 kg/m  General appearance: alert, cooperative, no distress, and thin Head: Normocephalic, without obvious abnormality, atraumatic Eyes: conjunctivae/corneas clear. PERRL, EOM's intact. Fundi benign. Ears: normal TM's and external ear canals both ears Nose: Nares normal. Septum midline. Mucosa normal. No drainage or sinus tenderness. Throat: lips, mucosa, and tongue normal; teeth and gums normal Neck: no adenopathy, no carotid bruit, no JVD, supple, symmetrical, trachea midline, and thyroid not enlarged, symmetric, no tenderness/mass/nodules Lungs: clear to auscultation bilaterally Breasts: normal appearance, no masses or tenderness, positive findings: fibrocystic changes in L breast.  No lymphadenopathy. Heart: regular rate and rhythm, S1, S2 normal, no murmur, click, rub or gallop Abdomen: soft, non-tender; bowel sounds normal; no masses,  no organomegaly Extremities: extremities normal, atraumatic, no cyanosis or edema Pulses: 2+ and symmetric Skin: Skin color, texture, turgor  normal. No rashes or lesions Lymph nodes: Cervical, supraclavicular, and axillary nodes normal. Neurologic: Alert and oriented X 3, normal strength and tone. Normal symmetric reflexes. Normal coordination and gait    Wt Readings from Last 3 Encounters:  07/19/22 123 lb 9.6 oz (56.1 kg)  03/24/22 133 lb (60.3 kg)  03/11/22 131 lb 6.4 oz (59.6 kg)    Assessment:    Healthy female exam.      Plan:    Anticipatory guidance given including wearing seatbelts,  smoke detectors in the home, increasing physical activity, increasing p.o. intake of water and vegetables. -labs - See After Visit Summary for Counseling Recommendations   Well adult exam - Plan: Lipid panel  Fibrocystic changes of left breast -for continued of worsened symptoms obtain u/s.  Substance abuse (HCC)  -given info on rehab - Plan: CMP  Weight loss - Plan: CBC with Differential/Platelet, TSH, T4, Free, Hemoglobin A1c, CMP  Routine screening for STI (sexually transmitted infection) - Plan: C. trachomatis/N. gonorrhoeae RNA, RPR, HIV Antibody (routine testing w rflx)  Screening for cholesterol level - Plan: Lipid panel  F/u in the 4-6 wks, sooner if needed  Abbe Amsterdam, MD

## 2022-07-20 LAB — RPR: RPR Ser Ql: NONREACTIVE

## 2022-07-20 LAB — HIV ANTIBODY (ROUTINE TESTING W REFLEX): HIV 1&2 Ab, 4th Generation: NONREACTIVE

## 2022-07-20 LAB — C. TRACHOMATIS/N. GONORRHOEAE RNA
C. trachomatis RNA, TMA: NOT DETECTED
N. gonorrhoeae RNA, TMA: NOT DETECTED

## 2022-08-01 ENCOUNTER — Encounter: Payer: Self-pay | Admitting: Family Medicine

## 2022-08-24 ENCOUNTER — Emergency Department (HOSPITAL_COMMUNITY)
Admission: EM | Admit: 2022-08-24 | Discharge: 2022-08-25 | Disposition: A | Discharge status: Other Acute Inpatient Hospital | Payer: Federal, State, Local not specified - Other | Attending: Emergency Medicine | Admitting: Emergency Medicine

## 2022-08-24 ENCOUNTER — Other Ambulatory Visit: Payer: Self-pay

## 2022-08-24 ENCOUNTER — Encounter (HOSPITAL_COMMUNITY): Payer: Self-pay | Admitting: Emergency Medicine

## 2022-08-24 ENCOUNTER — Ambulatory Visit (HOSPITAL_COMMUNITY)
Admission: RE | Admit: 2022-08-24 | Discharge: 2022-08-24 | Disposition: A | Discharge status: Home / Self Care | Payer: Self-pay | Attending: Psychiatry | Admitting: Psychiatry

## 2022-08-24 DIAGNOSIS — N9489 Other specified conditions associated with female genital organs and menstrual cycle: Secondary | ICD-10-CM | POA: Insufficient documentation

## 2022-08-24 DIAGNOSIS — Z1152 Encounter for screening for COVID-19: Secondary | ICD-10-CM | POA: Insufficient documentation

## 2022-08-24 DIAGNOSIS — R45851 Suicidal ideations: Secondary | ICD-10-CM | POA: Insufficient documentation

## 2022-08-24 DIAGNOSIS — F411 Generalized anxiety disorder: Secondary | ICD-10-CM | POA: Diagnosis present

## 2022-08-24 DIAGNOSIS — R7309 Other abnormal glucose: Secondary | ICD-10-CM | POA: Insufficient documentation

## 2022-08-24 DIAGNOSIS — F1910 Other psychoactive substance abuse, uncomplicated: Secondary | ICD-10-CM

## 2022-08-24 DIAGNOSIS — F1994 Other psychoactive substance use, unspecified with psychoactive substance-induced mood disorder: Secondary | ICD-10-CM

## 2022-08-24 DIAGNOSIS — F3181 Bipolar II disorder: Secondary | ICD-10-CM | POA: Insufficient documentation

## 2022-08-24 DIAGNOSIS — F319 Bipolar disorder, unspecified: Secondary | ICD-10-CM | POA: Diagnosis present
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
## 2022-08-24 NOTE — ED Notes (Signed)
Patient stepped outside  

## 2022-08-24 NOTE — ED Provider Triage Note (Signed)
  Emergency Medicine Provider Triage Evaluation Note  MRN:  [id number]  Arrival date & time: 08/24/22    Medically screening exam initiated at 11:18 PM.   CC:   Suicidal   HPI:  Lori Hull is a 21 y.o. year-old female presents to the ED with chief complaint of SI.  States that she has been depressed.  Has lost her home and her car. States that she is not getting any help from her family and not getting along with her family.  History provided by patient. ROS:  -As included in HPI PE:   Vitals:   08/24/22 2248  BP: (!) 95/55  Pulse: (!) 116  Resp: 18  Temp: 98.4 F (36.9 C)  SpO2: 100%    Non-toxic appearing No respiratory distress  MDM:  Based on signs and symptoms, SI is highest on my differential. I've ordered labs and psych hold orders in triage to expedite lab/diagnostic workup.  Patient was informed that the remainder of the evaluation will be completed by another provider, this initial triage assessment does not replace that evaluation, and the importance of remaining in the ED until their evaluation is complete.    Montine Circle, PA-C 08/24/22 2319

## 2022-08-24 NOTE — ED Triage Notes (Signed)
Patient reports suicidal ideation but did not disclose he plan at triage , intermittent A/V hallucinations .

## 2022-08-24 NOTE — ED Notes (Addendum)
PATIENT IS DRESSED OUT  INTO [facility].WITH 3 BAGS (bookbag ,1 beloning bag,1 duffle bag

## 2022-08-25 ENCOUNTER — Inpatient Hospital Stay (HOSPITAL_COMMUNITY)
Admission: AD | Admit: 2022-08-25 | Discharge: 2022-08-31 | DRG: 885 | Disposition: A | Discharge status: Home / Self Care | Payer: Federal, State, Local not specified - Other | Source: Intra-hospital | Attending: Psychiatry | Admitting: Psychiatry

## 2022-08-25 ENCOUNTER — Encounter (HOSPITAL_COMMUNITY): Payer: Self-pay | Admitting: Psychiatry

## 2022-08-25 DIAGNOSIS — Z87820 Personal history of traumatic brain injury: Secondary | ICD-10-CM

## 2022-08-25 DIAGNOSIS — K5900 Constipation, unspecified: Secondary | ICD-10-CM | POA: Diagnosis present

## 2022-08-25 DIAGNOSIS — F3181 Bipolar II disorder: Secondary | ICD-10-CM

## 2022-08-25 DIAGNOSIS — F1994 Other psychoactive substance use, unspecified with psychoactive substance-induced mood disorder: Secondary | ICD-10-CM

## 2022-08-25 DIAGNOSIS — Z5902 Unsheltered homelessness: Secondary | ICD-10-CM

## 2022-08-25 DIAGNOSIS — G4700 Insomnia, unspecified: Secondary | ICD-10-CM | POA: Diagnosis present

## 2022-08-25 DIAGNOSIS — F1410 Cocaine abuse, uncomplicated: Secondary | ICD-10-CM | POA: Diagnosis present

## 2022-08-25 DIAGNOSIS — F411 Generalized anxiety disorder: Secondary | ICD-10-CM

## 2022-08-25 DIAGNOSIS — F4010 Social phobia, unspecified: Secondary | ICD-10-CM | POA: Diagnosis present

## 2022-08-25 DIAGNOSIS — F3132 Bipolar disorder, current episode depressed, moderate: Secondary | ICD-10-CM | POA: Diagnosis present

## 2022-08-25 DIAGNOSIS — Z23 Encounter for immunization: Secondary | ICD-10-CM | POA: Diagnosis not present

## 2022-08-25 DIAGNOSIS — R45851 Suicidal ideations: Secondary | ICD-10-CM | POA: Diagnosis present

## 2022-08-25 DIAGNOSIS — F319 Bipolar disorder, unspecified: Secondary | ICD-10-CM

## 2022-08-25 DIAGNOSIS — K30 Functional dyspepsia: Secondary | ICD-10-CM | POA: Diagnosis present

## 2022-08-25 DIAGNOSIS — Z8669 Personal history of other diseases of the nervous system and sense organs: Secondary | ICD-10-CM

## 2022-08-25 DIAGNOSIS — F1910 Other psychoactive substance abuse, uncomplicated: Secondary | ICD-10-CM

## 2022-08-25 DIAGNOSIS — F17290 Nicotine dependence, other tobacco product, uncomplicated: Secondary | ICD-10-CM | POA: Diagnosis present

## 2022-08-25 DIAGNOSIS — Z20822 Contact with and (suspected) exposure to covid-19: Secondary | ICD-10-CM | POA: Diagnosis present

## 2022-08-25 DIAGNOSIS — R519 Headache, unspecified: Secondary | ICD-10-CM

## 2022-08-25 DIAGNOSIS — Z79899 Other long term (current) drug therapy: Secondary | ICD-10-CM

## 2022-08-25 DIAGNOSIS — Z818 Family history of other mental and behavioral disorders: Secondary | ICD-10-CM | POA: Diagnosis not present

## 2022-08-25 LAB — CBC WITH DIFFERENTIAL/PLATELET
Abs Immature Granulocytes: 0.03 10*3/uL (ref 0.00–0.07)
Basophils Absolute: 0.1 10*3/uL (ref 0.0–0.1)
Basophils Relative: 1 %
Eosinophils Absolute: 0.1 10*3/uL (ref 0.0–0.5)
Eosinophils Relative: 1 %
HCT: 40 % (ref 36.0–46.0)
Hemoglobin: 13.3 g/dL (ref 12.0–15.0)
Immature Granulocytes: 0 %
Lymphocytes Relative: 23 %
Lymphs Abs: 2.5 10*3/uL (ref 0.7–4.0)
MCH: 31.3 pg (ref 26.0–34.0)
MCHC: 33.3 g/dL (ref 30.0–36.0)
MCV: 94.1 fL (ref 80.0–100.0)
Monocytes Absolute: 1.2 10*3/uL — ABNORMAL HIGH (ref 0.1–1.0)
Monocytes Relative: 11 %
Neutro Abs: 6.8 10*3/uL (ref 1.7–7.7)
Neutrophils Relative %: 64 %
Platelets: 312 10*3/uL (ref 150–400)
RBC: 4.25 MIL/uL (ref 3.87–5.11)
RDW: 12.1 % (ref 11.5–15.5)
WBC: 10.5 10*3/uL (ref 4.0–10.5)
nRBC: 0 % (ref 0.0–0.2)

## 2022-08-25 LAB — RESP PANEL BY RT-PCR (FLU A&B, COVID) ARPGX2
Influenza A by PCR: NEGATIVE
Influenza B by PCR: NEGATIVE
SARS Coronavirus 2 by RT PCR: NEGATIVE

## 2022-08-25 LAB — SALICYLATE LEVEL: Salicylate Lvl: <7 mg/dL — ABNORMAL LOW (ref 7.0–30.0)

## 2022-08-25 LAB — COMPREHENSIVE METABOLIC PANEL
ALT: 23 U/L (ref 0–44)
AST: 27 U/L (ref 15–41)
Albumin: 4.4 g/dL (ref 3.5–5.0)
Alkaline Phosphatase: 60 U/L (ref 38–126)
Anion gap: 15 (ref 5–15)
BUN: 28 mg/dL — ABNORMAL HIGH (ref 6–20)
CO2: 20 mmol/L — ABNORMAL LOW (ref 22–32)
Calcium: 9.8 mg/dL (ref 8.9–10.3)
Chloride: 103 mmol/L (ref 98–111)
Creatinine, Ser: 1.22 mg/dL — ABNORMAL HIGH (ref 0.44–1.00)
GFR, Estimated: >60 mL/min (ref >60)
Glucose, Bld: 84 mg/dL (ref 70–99)
Potassium: 4.4 mmol/L (ref 3.5–5.1)
Sodium: 138 mmol/L (ref 135–145)
Total Bilirubin: 0.7 mg/dL (ref 0.3–1.2)
Total Protein: 7.3 g/dL (ref 6.5–8.1)

## 2022-08-25 LAB — I-STAT BETA HCG BLOOD, ED (MC, WL, AP ONLY): I-stat hCG, quantitative: <5 m[IU]/mL (ref <5)

## 2022-08-25 LAB — RAPID URINE DRUG SCREEN, HOSP PERFORMED
Amphetamines: POSITIVE — AB
Barbiturates: NOT DETECTED
Benzodiazepines: POSITIVE — AB
Cocaine: POSITIVE — AB
Opiates: NOT DETECTED
Tetrahydrocannabinol: NOT DETECTED

## 2022-08-25 LAB — ACETAMINOPHEN LEVEL: Acetaminophen (Tylenol), Serum: <10 ug/mL — ABNORMAL LOW (ref 10–30)

## 2022-08-25 LAB — ETHANOL: Alcohol, Ethyl (B): <10 mg/dL (ref <10)

## 2022-08-25 MED ORDER — GABAPENTIN 300 MG PO CAPS
300.0000 mg | ORAL_CAPSULE | Freq: Three times a day (TID) | ORAL | Status: DC
  Administered 2022-08-25 – 2022-08-31 (×17): 300 mg via ORAL
  Filled 2022-08-25 (×13): qty 1
  Filled 2022-08-25: qty 42
  Filled 2022-08-25 (×7): qty 1
  Filled 2022-08-25 (×2): qty 42
  Filled 2022-08-25 (×2): qty 1

## 2022-08-25 MED ORDER — NICOTINE 14 MG/24HR TD PT24
14.0000 mg | MEDICATED_PATCH | Freq: Every day | TRANSDERMAL | Status: DC
  Administered 2022-08-26 – 2022-08-31 (×6): 14 mg via TRANSDERMAL
  Filled 2022-08-25 (×2): qty 1
  Filled 2022-08-25: qty 14
  Filled 2022-08-25 (×6): qty 1

## 2022-08-25 MED ORDER — FLUOXETINE HCL 20 MG PO CAPS
40.0000 mg | ORAL_CAPSULE | Freq: Every day | ORAL | Status: DC
  Administered 2022-08-25: 40 mg via ORAL
  Filled 2022-08-25: qty 2

## 2022-08-25 MED ORDER — LAMOTRIGINE 25 MG PO TABS
25.0000 mg | ORAL_TABLET | Freq: Every day | ORAL | Status: DC
  Administered 2022-08-25 – 2022-08-27 (×3): 25 mg via ORAL
  Filled 2022-08-25 (×5): qty 1

## 2022-08-25 MED ORDER — TRAZODONE HCL 50 MG PO TABS: 50.0000 mg | ORAL_TABLET | Freq: Every evening | ORAL | Status: DC | PRN

## 2022-08-25 MED ORDER — INFLUENZA VAC SPLIT QUAD 0.5 ML IM SUSY
0.5000 mL | PREFILLED_SYRINGE | INTRAMUSCULAR | Status: AC
  Administered 2022-08-27: 0.5 mL via INTRAMUSCULAR
  Filled 2022-08-25: qty 0.5

## 2022-08-25 MED ORDER — ACETAMINOPHEN 325 MG PO TABS
650.0000 mg | ORAL_TABLET | Freq: Four times a day (QID) | ORAL | Status: DC | PRN
  Administered 2022-08-27: 650 mg via ORAL
  Filled 2022-08-25: qty 2

## 2022-08-25 MED ORDER — FLUOXETINE HCL 20 MG PO CAPS
40.0000 mg | ORAL_CAPSULE | Freq: Every day | ORAL | Status: DC
  Administered 2022-08-26 – 2022-08-31 (×6): 40 mg via ORAL
  Filled 2022-08-25: qty 2
  Filled 2022-08-25: qty 28
  Filled 2022-08-25 (×6): qty 2

## 2022-08-25 MED ORDER — LAMOTRIGINE 100 MG PO TABS
100.0000 mg | ORAL_TABLET | Freq: Every day | ORAL | Status: DC
  Filled 2022-08-25: qty 1

## 2022-08-25 MED ORDER — LAMOTRIGINE 25 MG PO TABS: 100.0000 mg | ORAL_TABLET | Freq: Every day | ORAL | Status: DC

## 2022-08-25 MED ORDER — NORETHINDRON-ETHINYL ESTRAD-FE 1-20/1-30/1-35 MG-MCG PO TABS: 1.0000 | ORAL_TABLET | Freq: Every day | ORAL | Status: DC

## 2022-08-25 MED ORDER — GABAPENTIN 300 MG PO CAPS: 300.0000 mg | ORAL_CAPSULE | Freq: Three times a day (TID) | ORAL | Status: DC

## 2022-08-25 MED ORDER — MAGNESIUM HYDROXIDE 400 MG/5ML PO SUSP: 30.0000 mL | Freq: Every day | ORAL | Status: DC | PRN

## 2022-08-25 MED ORDER — ALUM & MAG HYDROXIDE-SIMETH 200-200-20 MG/5ML PO SUSP: 30.0000 mL | ORAL | Status: DC | PRN

## 2022-08-25 MED ORDER — QUETIAPINE FUMARATE 100 MG PO TABS
100.0000 mg | ORAL_TABLET | Freq: Every day | ORAL | Status: DC
  Administered 2022-08-25 – 2022-08-30 (×6): 100 mg via ORAL
  Filled 2022-08-25 (×6): qty 1
  Filled 2022-08-25: qty 14
  Filled 2022-08-25 (×3): qty 1

## 2022-08-25 MED ORDER — LAMOTRIGINE 25 MG PO TABS
100.0000 mg | ORAL_TABLET | Freq: Every day | ORAL | Status: DC
  Filled 2022-08-25: qty 4

## 2022-08-25 NOTE — ED Notes (Signed)
Spoke to Dr. Tyrone Nine. PT EKG within normal limits. Safe transport called at this time and outside waiting for pt. PT signed voluntary tx form and given to [place] at this time to fax to [facility]. Pt sitter walked with pt out to safe transport with all of pt belongings and d/c paperwork.

## 2022-08-25 NOTE — ED Notes (Signed)
Called Herold Harms RN at this time and report given.

## 2022-08-25 NOTE — ED Provider Notes (Addendum)
Patient was accepted into [facility].  Plan for her to go to behavioral health urgent care now.  Patient is voluntary and so the recommending discharge now and have her self transport over there.     Deno Etienne, DO 08/25/22 1231

## 2022-08-25 NOTE — Consult Note (Cosign Needed Addendum)
   Lori Hull, 21 y.o., female patient presented to [facility] with suicidal ideations and polysubstance abuse.  Patient seen via telepsych by this provider; chart reviewed and consulted with Dr. Dwyane Dee on 08/25/22.  On evaluation Lori Hull continues to reports suicidal ideations, worse since admission but she does have state a plan.  Her suicidal ideations are triggered by psychosocial stressors, recent job and vehicle loss and chronic substance abuse.  Pt is clear and coherent, and able to appropriately states her needs.  She states she wants treatment for substance abuse and will to accept referral today.  Pt denies hx of complicated substance abuse withdrawals.  Per notes, she has been cooperative with staff since admission and has been mostly sleeping. Pt denies nausea, vomiting, tremor or other withdrawal concerns.    Bun and Creatinine are marginally elevate at 28 and 1.22 respectively but no AKI.  LFT's are WNL; WBC are WNL--no concerns for leukocytosis.  Influenza and SARS are negative; hCG is negative.   She needs an updated EKG to rule out prolonged QT intervals.  She has already been medically cleared.  During evaluation Lori Hull is sitting upright on hospital gurney; She is alert/oriented x 4; appears nervous but is otherwise cooperative; and mood congruent with affect.  Patient is speaking in a clear tone at moderate volume, and normal pace; with good eye contact.  Her thought process is coherent and relevant; There is no indication that she is currently responding to internal/external stimuli or experiencing delusional thought content.  Patient does endorses suicidal ideations but no plan or intent today. She denies self-harm/homicidal ideation, psychosis, and paranoia.  Patient has remained calm throughout assessment and has answered questions appropriately.   Diagnosis: Psychoactive substance induced mood disorder; Bipolar 2 disorder, GAD  Treatment Plan Summary:  discussed with pt concordance. Pt with passive suicidal ideations;Her situation is complicated by polysubstance abuse and psychosocial stressors.  Pt's drug use makes her impulsive and she lacks insight.  Pt requests  treatment with drug abuse and mental health concerns.   Pt needs an updated EKG to rule out prolonged QT intervals.   Her home medications have already been restarted as follows: Continue: Lamictal  100mg  po qhs for mood/anger Fluoxetine 40mg  po daily for depression/anxiety  Start Gabapentin 300mg  po tid for anxiety associated with withdrawal symptoms Hydroxyzine 25mg  po TID prn for anxiety/sleep   Disposition:  Recommend psychiatric Inpatient admission when medically cleared.  Initially discussed transfer to [facility] while awaiting bed at their facility based crisis enter for detox--but pt was declined d/t no available beds.  Lori Hull has accepted pt and assigned a bed.  Pt has signed voluntary forms and agrees with plan of care. Plan is for transport today.   Spoke with Dr. Deno Etienne who is apprised of above recommendation and disposition via secure chat.   Names of all persons participating in this telemedicine service and their role in this encounter. Name: Lori Hull Role: Patient  Name: [name] Role: PMHNP

## 2022-08-25 NOTE — ED Notes (Signed)
Messages [name] NP for TTS at this time.

## 2022-08-25 NOTE — Progress Notes (Signed)
Pt was accepted to [facility] 08/25/22; Bed Assignment 301-1  Please send the voluntary consent signed and faxed to [phone].  Pt meets inpatient criteria per [name], NP,  Attending Physician will be Dr. Caswell Corwin  Report can be called to: Adult unit: [phone]  Pt can arrive: [facility]  [facility] will coordinate   Care Team notified: [facility] Lynnda Shields, RN, [name], NP, Patsy Baltimore, RN, Deno Etienne, [place], [place] 08/25/2022 @ 11:55 AM

## 2022-08-25 NOTE — Progress Notes (Signed)
Patient did attend the evening speaker NA meeting.  
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
## 2022-08-25 NOTE — ED Provider Notes (Signed)
[facility] EMERGENCY DEPARTMENT Provider Note   CSN: [id number] Arrival date & time: 08/24/22  2216     History  Chief Complaint  Patient presents with   Suicidal    Lori Hull is a 21 y.o. female.  HPI     This is a 21 year old female with a history of polysubstance abuse who presents with suicidal ideation.  Patient reports increased depressive symptoms.  She has had multiple stressors at home including loss of her home and car.  She is not getting along with her family.  Patient reports that she was recently clean for over 30 days from substance abuse; however, she relapsed and went on a 2-day bender.  She reports recently taking methamphetamines.  She last took these yesterday.  She was actually seen and evaluated at [facility] yesterday.  At that time she did not endorse suicidal ideation.  However, it was thought that she was likely withdrawing.  She left and declined further evaluation.  She does not have any plan.  She states previously she has tried to overdose, hit her head on the wall, or burn herself.  She does not currently have these thoughts.  Denies hallucinations.  Home Medications Prior to Admission medications   Medication Sig Start Date End Date Taking? Authorizing Provider  FLUoxetine (PROZAC) 40 MG capsule Take 1 capsule (40 mg total) by mouth every morning. 07/08/21 08/26/23 Yes Arfeen, Phillips Grout, MD  gabapentin (NEURONTIN) 300 MG capsule Take 1 capsule (300 mg total) by mouth 2 (two) times daily. Thirty days bridge supply given by Covering MD. Patient must established care with new provider. 07/08/21  Yes Arfeen, Phillips Grout, MD  lamoTRIgine (LAMICTAL) 100 MG tablet Take 100 mg by mouth daily. 08/21/21  Yes [provider]  QUEtiapine (SEROQUEL) 100 MG tablet Take 1 tablet (100 mg total) by mouth at bedtime. Patient taking differently: Take 100 mg by mouth at bedtime as needed (sleep). 07/08/21 08/26/23 Yes Arfeen, Phillips Grout, MD   norethindrone-ethinyl estradiol-iron (ESTROSTEP FE) 1-20/1-30/1-35 MG-MCG tablet Take 1 tablet by mouth daily. Patient not taking: Reported on 07/19/2022 03/11/22   Deeann Saint, MD      Allergies    Patient has no known allergies.    Review of Systems   Review of Systems  Constitutional:  Negative for fever.  Psychiatric/Behavioral:  Positive for suicidal ideas. Negative for self-injury. The patient is nervous/anxious.   All other systems reviewed and are negative.   Physical Exam Updated Vital Signs BP (!) 95/55 (BP Location: Right Arm)   Pulse (!) 116   Temp 98.4 F (36.9 C) (Oral)   Resp 18   SpO2 100%  Physical Exam Vitals and nursing note reviewed.  Constitutional:      Appearance: She is well-developed. She is not ill-appearing.  HENT:     Head: Normocephalic and atraumatic.  Eyes:     Pupils: Pupils are equal, round, and reactive to light.  Cardiovascular:     Rate and Rhythm: Regular rhythm. Tachycardia present.  Pulmonary:     Effort: Pulmonary effort is normal. No respiratory distress.  Abdominal:     Palpations: Abdomen is soft.  Musculoskeletal:     Cervical back: Neck supple.  Skin:    General: Skin is warm and dry.  Neurological:     Mental Status: She is alert and oriented to person, place, and time.  Psychiatric:     Comments: Slightly pressured in speech but cooperative  ED Results / Procedures / Treatments   Labs (all labs ordered are listed, but only abnormal results are displayed) Labs Reviewed  COMPREHENSIVE METABOLIC PANEL - Abnormal; Notable for the following components:      Result Value   CO2 20 (*)    BUN 28 (*)    Creatinine, Ser 1.22 (*)    All other components within normal limits  SALICYLATE LEVEL - Abnormal; Notable for the following components:   Salicylate Lvl <7.0 (*)    All other components within normal limits  ACETAMINOPHEN LEVEL - Abnormal; Notable for the following components:   Acetaminophen (Tylenol), Serum  <10 (*)    All other components within normal limits  CBC WITH DIFFERENTIAL/PLATELET - Abnormal; Notable for the following components:   Monocytes Absolute 1.2 (*)    All other components within normal limits  RESP PANEL BY RT-PCR (FLU A&B, COVID) ARPGX2  ETHANOL  RAPID URINE DRUG SCREEN, HOSP PERFORMED  I-STAT BETA HCG BLOOD, ED ([facility], [facility], [facility] ONLY)  CBG MONITORING, ED    EKG None  Radiology No results found.  Procedures Procedures    Medications Ordered in ED Medications - No data to display  ED Course/ Medical Decision Making/ A&P                           Medical Decision Making  This patient presents to the ED for concern of suicidal ideation, this involves an extensive number of treatment options, and is a complaint that carries with it a high risk of complications and morbidity.  I considered the following differential and admission for this acute, potentially life threatening condition.  The differential diagnosis includes [place] ideation, polysubstance abuse, withdrawal  MDM:    This is a 21 year old female who presents with concerns for suicidal ideation.  She does not have an active plan but reports history of the same.  Also reports history of polysubstance abuse and recent methamphetamine abuse.  She is pressured in her speech but cooperative and directable.  She does not have a current plan for SI.  Tylenol and salicylates normal.  Lab work obtained and reviewed and is reassuring.  UDS and urinalysis is pending.  Patient is cleared for TTS evaluation.  (Labs, imaging, consults)  Labs: I Ordered, and personally interpreted labs.  The pertinent results include:  CBC, CMP, urinalysis, UDS, Tylenol and salicylate levels  Imaging Studies ordered: I ordered imaging studies including none I independently visualized and interpreted imaging. I agree with the radiologist interpretation  Additional history obtained from chart review.  External records from outside source  obtained and reviewed including behavioral health assessment  Cardiac Monitoring: The patient was maintained on a cardiac monitor.  I personally viewed and interpreted the cardiac monitored which showed an underlying rhythm of: Sinus tachycardia  Reevaluation: After the interventions noted above, I reevaluated the patient and found that they have :stayed the same  Social Determinants of Health: Polysubstance use  Disposition: Ending TTS evaluation  Co morbidities that complicate the patient evaluation  Past Medical History:  Diagnosis Date   Allergy    Anxiety    Depression    Heart murmur    Hepatitis C    Protein deficiency (HCC)    Seizures (HCC)      Medicines No orders of the defined types were placed in this encounter.   I have reviewed the patients home medicines and have made adjustments as needed  Problem List /  ED Course: Problem List Items Addressed This Visit   None Visit Diagnoses     Suicidal ideation    -  Primary   Polysubstance abuse ([place])                       Final Clinical Impression(s) / ED Diagnoses Final diagnoses:  Suicidal ideation  Polysubstance abuse [facility])    Rx / DC Orders ED Discharge Orders     None         Torrion Witter, Barbette Hair, MD 08/25/22 [phone]

## 2022-08-26 DIAGNOSIS — F3132 Bipolar disorder, current episode depressed, moderate: Principal | ICD-10-CM

## 2022-08-26 DIAGNOSIS — F1910 Other psychoactive substance abuse, uncomplicated: Secondary | ICD-10-CM

## 2022-08-26 NOTE — Progress Notes (Signed)
   08/26/22 0515  Sleep  Number of Hours 6.5

## 2022-08-26 NOTE — BHH Suicide Risk Assessment (Signed)
[place] INPATIENT:  Family/Significant Other Suicide Prevention Education  Suicide Prevention Education:  Education Completed; Marieke Lubke [phone] has been identified by the patient as the family member/significant other with whom the patient will be residing, and identified as the person(s) who will aid the patient in the event of a mental health crisis (suicidal ideations/suicide attempt).  With written consent from the patient, the family member/significant other has been provided the following suicide prevention education, prior to the and/or following the discharge of the patient.  The suicide prevention education provided includes the following: Suicide risk factors Suicide prevention and interventions National Suicide Hotline telephone number [facility] assessment telephone number [facility] Emergency Assistance [place] and/or Residential Mobile Crisis Unit telephone number  Request made of family/significant other to: Remove weapons (e.g., guns, rifles, knives), all items previously/currently identified as safety concern.   Remove drugs/medications (over-the-counter, prescriptions, illicit drugs), all items previously/currently identified as a safety concern.  The family member verbalizes understanding of the suicide prevention education information provided.  The family member/significant other agrees to remove the items of safety concern listed above.  [place] MSW, LCSW 08/26/2022, 2:48 PM

## 2022-08-26 NOTE — Progress Notes (Signed)
Pt affect flat, mood anxious, rated day a 5/10 and goal was to work on coping skills for depression. Pt received lamictal 25mg  as requested, states that she has only been taking this dosage currently. Pt states that she gets a "rash" when she takes lamictal 100mg . Denies SI/HI or hallucinations (a) 15 min checks (r) safety maintained.

## 2022-08-26 NOTE — BHH Suicide Risk Assessment (Signed)
Suicide Risk Assessment  Admission Assessment    [facility] Admission Suicide Risk Assessment   Nursing information obtained from:  Patient Demographic factors:  Adolescent or young adult, Gay, lesbian, or bisexual orientation, Low socioeconomic status Current Mental Status:  Suicidal ideation indicated by patient Loss Factors:  Financial problems / change in socioeconomic status Historical Factors:  Domestic violence Risk Reduction Factors:  NA  Total Time spent with patient: 20 minutes Principal Problem: Bipolar disorder, current episode depressed, moderate ([place]) Diagnosis:  Principal Problem:   Bipolar disorder, current episode depressed, moderate ([place]) Active Problems:   GAD (generalized anxiety disorder)   Social anxiety disorder   Polysubstance abuse ([place])  Subjective Data:   Lori Hull is a 21 year old female with a past psychiatric history significant for bipolar disorder, generalized anxiety disorder, social anxiety disorder, panic disorder, and ADHD was admitted to [facility] from [facility] ED due to suicidal ideations and polysubstance abuse.  Continued Clinical Symptoms:   Patient continues to express depressive episodes that have been going on for a week.  Patient's depressive episodes are characterized by the following symptoms: crying spells, hopelessness, and lack of motivation.  Patient also endorses anxiety she rates at 3 out of 10.  Patient eats her anxiety when around people and 8 out of 10.  Patient also endorses experiencing panic attacks at least once per day.  Patient denies suicidal or homicidal ideations.  She further denies auditory or visual hallucinations and does not appear to be responding to internal/external stimuli.  Patient does not appear to be paranoid and denies delusional thoughts at this time.  The "Alcohol Use Disorders Identification Test", Guidelines for Use in Primary Care, Second Edition.  World Pharmacologist  [facility]). Score between 0-7:  no or low risk or alcohol related problems. Score between 8-15:  moderate risk of alcohol related problems. Score between 16-19:  high risk of alcohol related problems. Score 20 or above:  warrants further diagnostic evaluation for alcohol dependence and treatment.   CLINICAL FACTORS:   Severe Anxiety and/or Agitation Panic Attacks Bipolar Disorder:   Depressive phase Alcohol/Substance Abuse/Dependencies More than one psychiatric diagnosis   Musculoskeletal: Strength & Muscle Tone: within normal limits Gait & Station: normal Patient leans: N/A  Psychiatric Specialty Exam:  Presentation  General Appearance:  Appropriate for Environment; Casual  Eye Contact: Good  Speech: Clear and Coherent; Normal Rate  Speech Volume: Normal  Handedness: Right   Mood and Affect  Mood: Anxious; Depressed  Affect: Constricted   Thought Process  Thought Processes: Coherent; Goal Directed; Linear  Descriptions of Associations:Intact  Orientation:Full (Time, Place and Person)  Thought Content:Logical; WDL  History of Schizophrenia/Schizoaffective disorder:No data recorded Duration of Psychotic Symptoms:No data recorded Hallucinations:Hallucinations: None  Ideas of Reference:None  Suicidal Thoughts:Suicidal Thoughts: No SI Passive Intent and/or Plan: Without Intent; Without Plan  Homicidal Thoughts:Homicidal Thoughts: No   Sensorium  Memory: Immediate Good; Recent Good; Remote Good  Judgment: Fair  Insight: Fair   Community education officer  Concentration: Good  Attention Span: Good  Recall: Good  Fund of Knowledge: Good  Language: Good   Psychomotor Activity  Psychomotor Activity: Psychomotor Activity: Normal   Assets  Assets: Communication Skills; Desire for Improvement; Housing; Social Support   Sleep  Sleep: Sleep: Fair (Patient reports that the receiving too much sleep) Number of Hours of Sleep:  6    Physical Exam: Physical Exam Psychiatric:        Attention and Perception: Attention and perception normal. She  does not perceive auditory or visual hallucinations.        Mood and Affect: Affect normal. Mood is anxious and depressed.        Speech: Speech normal.        Behavior: Behavior normal. Behavior is not agitated. Behavior is cooperative.        Thought Content: Thought content normal. Thought content is not paranoid or delusional. Thought content does not include homicidal or suicidal ideation.        Cognition and Memory: Cognition and memory normal.        Judgment: Judgment is impulsive.    Review of Systems  Constitutional: Negative.   HENT: Negative.    Eyes: Negative.   Respiratory: Negative.    Cardiovascular: Negative.   Gastrointestinal: Negative.   Musculoskeletal: Negative.   Skin: Negative.   Neurological: Negative.   Psychiatric/Behavioral:  Positive for depression and substance abuse. Negative for hallucinations and suicidal ideas. The patient is nervous/anxious. The patient does not have insomnia.    Blood pressure (!) 114/56, pulse 87, temperature 98.3 F (36.8 C), temperature source Oral, resp. rate 18, height 5\' 8"  (1.727 m), weight 52.7 kg. Body mass index is 17.67 kg/m.   COGNITIVE FEATURES THAT CONTRIBUTE TO RISK:  None    SUICIDE RISK:   Moderate:  Frequent suicidal ideation with limited intensity, and duration, some specificity in terms of plans, no associated intent, good self-control, limited dysphoria/symptomatology, some risk factors present, and identifiable protective factors, including available and accessible social support.  PLAN OF CARE:   Daily contact with patient to assess and evaluate symptoms and progress in treatment and Medication management   Observation Level/Precautions:  15 minute checks  Laboratory:  Labs independently reviewed on 10/5  Psychotherapy:  Unit Group sessions  Medications:  See [facility]  Consultations:   To be determined   Discharge Concerns:  Safety, medication compliance, mood stability  Estimated LOS: 5-7 days  Other:  N/A    PLAN Safety and Monitoring: Voluntary admission to inpatient psychiatric unit for safety, stabilization and treatment Daily contact with patient to assess and evaluate symptoms and progress in treatment Patient's case to be discussed in multi-disciplinary team meeting Observation Level : q15 minute checks Vital signs: q12 hours Precautions: safety   Diagnoses:  Principal Problem:   Bipolar disorder, current episode depressed, moderate (HCC) Active Problems:   GAD (generalized anxiety disorder)   Social anxiety disorder   Polysubstance abuse (HCC)   Bipolar disorder, current episode depressed, moderate -Start Seroquel 100 mg at bedtime -Started Prozac 40 mg daily -Started Lamictal at 25 mg at bedtime.  Prozac to be titrated up to 50 mg in the next 2 to 3 days, followed by 100 mg in 2-3 more days.  Lamictal to be titrated status due to patient taking 100 mg a week ago and being stable.   Generalized anxiety disorder Social anxiety disorder -Start Gabapentin 300 mg 3 times daily -Start Hydroxyzine 25 mg every 6 hours PRN   Polysubstance abuse -UDS positive for cocaine, benzodiazepine, and amphetamine   Other PRNS -Start Tylenol 650 mg every 6 hours PRN for mild pain -Start Maalox 30 mg every 4 hrs PRN for indigestion -Start Milk of Magnesia as needed every 6 hrs for constipation -Start trazodone 50 mg at bedtime as needed for insomnia   Long Term Goal(s): Improvement in symptoms so as ready for discharge   Short Term Goals: Ability to identify changes in lifestyle to reduce recurrence of condition will improve,  Ability to verbalize feelings will improve, Ability to disclose and discuss suicidal ideas, Ability to demonstrate self-control will improve, Ability to identify and develop effective coping behaviors will improve, Ability to maintain clinical  measurements within normal limits will improve, Compliance with prescribed medications will improve, and Ability to identify triggers associated with substance abuse/mental health issues will improve.   Discharge Planning: Social work and case management to assist with discharge planning and identification of hospital follow-up needs prior to discharge Estimated LOS: 5-7 days Discharge Concerns: Need to establish a safety plan; Medication compliance and effectiveness Discharge Goals: Return home with outpatient referrals for mental health follow-up including medication management/psychotherapy  I certify that inpatient services furnished can reasonably be expected to improve the patient's condition.   Meta Hatchet, PA 08/26/2022, 3:42 PM

## 2022-08-26 NOTE — Group Note (Unsigned)
Date:  08/26/2022 Time:  12:05 PM  Group Topic/Focus:  Orientation:   The focus of this group is to educate the patient on the purpose and policies of crisis stabilization and provide a format to answer questions about their admission.  The group details unit policies and expectations of patients while admitted.     Participation Level:  {BHH PARTICIPATION TXMIW:80321}  Participation Quality:  {BHH PARTICIPATION QUALITY:22265}  Affect:  {BHH AFFECT:22266}  Cognitive:  {BHH COGNITIVE:22267}  Insight: {BHH Insight2:20797}  Engagement in Group:  {BHH ENGAGEMENT IN YYQMG:50037}  Modes of Intervention:  {BHH MODES OF INTERVENTION:22269}  Additional Comments:  ***  Jerrye Beavers 08/26/2022, 12:05 PM

## 2022-08-26 NOTE — [facility] Counselor (Signed)
Adult Comprehensive Assessment  Patient ID: Lori Hull, female   DOB: 04-17-2001, 21 y.o.   MRN: [id number]  Information Source: Information source: Patient  Current Stressors:  Patient states their primary concerns and needs for treatment are:: pt reports SI/Substance Use Disorder "I lost control of my life". Patient states their goals for this hospitilization and ongoing recovery are:: Medication Stabilization, improved physical health Educational / Learning stressors: difficulty focusing Employment / Job issues: unemployed Family Relationships: My mom helps out Surveyor, quantity / Lack of resources (include bankruptcy): I depend on my mom Housing / Lack of housing: I have been living in my car for approximately a week Physical health (include injuries & life threatening diseases): none reported Social relationships: I have very little friends Substance abuse: Methamphetamines, Opioids, Cocaine and Alcohol Bereavement / Loss: My dad died when I was 78 years old.  Living/Environment/Situation:  Living Arrangements: Other (Comment), Alone (pt was previously residing at an [facility] and was terminated due to relapse) Living conditions (as described by patient or guardian): Dangerous Who else lives in the home?: N/A Homeless How long has patient lived in current situation?: approximately one week What is atmosphere in current home: Dangerous, Temporary  Family History:  Marital status: Single Are you sexually active?: Yes What is your sexual orientation?: Bisexual Does patient have children?: No  Childhood History:  By whom was/is the patient raised?: Both parents Additional childhood history information: father passed away from a heart attack at age 32, [facility], mom was sad after that Description of patient's relationship with caregiver when they were a child: I was hateful and my dad annoyed me, good relationoship with mom How were you  disciplined when you got in trouble as a child/adolescent?: spankings Does patient have siblings?: Yes Number of Siblings: 1 Description of patient's current relationship with siblings: good but he's an addict  Did patient suffer any verbal/emotional/physical/sexual abuse as a child?: No Did patient suffer from severe childhood neglect?: No Has patient ever been sexually abused/assaulted/raped as an adolescent or adult?: No Was the patient ever a victim of a crime or a disaster?: No Witnessed domestic violence?: No Has patient been affected by domestic violence as an adult?: No  Education:  Highest 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?: No  Financial Resources:   Surveyor, quantity resources: Support from parents / caregiver Does patient have a Lawyer or guardian?: No  Alcohol/Substance Abuse:   What has been your use of drugs/alcohol within the last 12 months?: Methamphetamines daily use If attempted suicide, did drugs/alcohol play a role in this?: Yes Alcohol/Substance Abuse Treatment Hx: Past Tx, Inpatient, Attends AA/NA, Past Tx, Outpatient, Past detox, Relapse prevention program If yes, describe treatment: Web designer, Pharmacist, hospital  Social Support System:   Patient's Community Support System: Good Describe Community Support System: none reported Type of faith/religion: Agnostic How does patient's faith help to cope with current illness?: I mostly try to be by myself  Leisure/Recreation:   Do You Have Hobbies?: Yes Leisure and Hobbies: Reading and watching Television  Strengths/Needs:   What is the patient's perception of their strengths?: Compassion Patient states they can use these personal strengths during their treatment to contribute to their recovery: I just need to  believe in myself Patient  states these barriers may affect/interfere with their treatment: none reported Patient states these barriers may affect their return to the community: none reported  Discharge Plan:   Patient states concerns and preferences for aftercare planning are: Pt would prefer inpatient treatment Patient states they will know when they are safe and ready for discharge when: "When my mom is able to pick me up" Does patient have access to transportation?: Yes Does patient have financial barriers related to discharge medications?: Yes Patient description of barriers related to discharge medications: uninsured/unemployed Will patient be returning to same living situation after discharge?: No (pt will be residing with her mother after discharge)  Summary/Recommendations:   Summary and Recommendations (to be completed by the evaluator): 21year old f was adimitted  for increasing SI and substance use disorder. pt reports that she had been residing at the [facility] until she relapsed and was terminated from the program.Pt reports that her father died of a heart attack when she was 21 years old. pt also indicated that she recently learned of the death of her ex boyfriend had died of an overdose. While in the hospital the Pt can benefit from crisis stabilization, medication evaluation, group therapy, psycho-education, case management, and discharge planning. Upon discharge the Pt would like to return home with her mother. t is recommended that the Pt follow-up with a local outpatient provider for therapy and medication management. The Pt states that she would like to be referred to [facility] inpatient treatment. Pt should continue to take all medications as prescribed by physicians. While here, Jemila can benefit from crisis stabilization, medication management, therapeutic milieu, and referrals for services.   [place]. 08/26/2022 MSW, LCSW

## 2022-08-26 NOTE — Progress Notes (Signed)
D:  Patient denied SI and HI, contracts for safety.  Denied A/V hallucinations.  Denied pain. A:  Medications administered per MD orders.  Emotional support and encouragement given patient. R:  Safety maintained with 15 minute checks.  
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
## 2022-08-26 NOTE — Plan of Care (Signed)
Nurse discussed anxiety, depression and coping skills with patient.  

## 2022-08-27 ENCOUNTER — Encounter (HOSPITAL_COMMUNITY): Payer: Self-pay

## 2022-08-27 NOTE — Group Note (Signed)
LCSW Group Therapy Note   Group Date: 08/27/2022 Start Time: 1300 End Time: 1400  Type of Therapy: Group Therapy: Boundaries  Participation Level:  Active  Description of Group: This group will address the use of boundaries in their personal lives. Patients will explore why boundaries are important, the difference between healthy and unhealthy boundaries, and negative and positive outcomes of different boundaries and will look at how boundaries can be crossed.  Patients will be encouraged to identify current boundaries in their own lives and identify what kind of boundary is being set. Facilitators will guide patients in utilizing problem-solving interventions to address and correct types boundaries being used and to address when no boundary is being used. Understanding and applying boundaries will be explored and addressed for obtaining and maintaining a balanced life. Patients will be encouraged to explore ways to assertively make their boundaries and needs known to significant others in their lives, using other group members and facilitator for role play, support, and feedback.   Therapeutic Goals: 1. Patient will identify areas in their life where setting clear boundaries could be used to improve their life.  2. Patient will identify signs/triggers that a boundary is not being respected. 3. Patient will identify two ways to set boundaries in order to achieve balance in their lives: 4. Patient will demonstrate ability to communicate their needs and set boundaries through discussion and/or role plays  Summary of Progress/Problems: The Pt attended group and remained there the entire time.  The Pt accepted al worksheets and participated openly in the group discussion.  The Pt was appropriate with peers and remained on topic during the discussion.  The Pt was able to discuss the various types of boundaries that a person can experience and was also able to find ways to make their boundaries known to  other individuals around them.  The Pt was able to find more positive ways to address individuals who crossed their boundaries.   Darleen Crocker, LCSWA 08/27/2022  1:59 PM

## 2022-08-27 NOTE — Group Note (Signed)
Recreation Therapy Group Note   Group Topic:Team Building  Group Date: 08/27/2022 Start Time: 0930 End Time: 1000 Facilitators: Bev Drennen-McCall, LRT,CTRS Location: [address] Dayroom   Goal Area(s) Addresses:  Patient will effectively work with peer towards shared goal.  Patient will identify skills used to make activity successful.  Patient will identify how skills used during activity can be used to reach post d/c goals.   Group Description: Landing Pad. In teams of 3-5, patients were given 12 plastic drinking straws and an equal length of masking tape. Using the materials provided, patients were asked to build a landing pad to catch a golf ball dropped from approximately 5 feet in the air. All materials were required to be used by the team in their design. LRT facilitated post-activity discussion.   Affect/Mood: N/A   Participation Level: Did not attend    Clinical Observations/Individualized Feedback:     Plan: Continue to engage patient in RT group sessions 2-3x/week.   Dillyn Joaquin-McCall, LRT,CTRS 08/27/2022 12:06 PM

## 2022-08-27 NOTE — BH IP Treatment Plan (Signed)
Interdisciplinary Treatment and Diagnostic Plan Update  08/27/2022 Time of Session: 1000 Lori Hull MRN: [id number]  Principal Diagnosis: Bipolar disorder, current episode depressed, moderate (HCC)  Secondary Diagnoses: Principal Problem:   Bipolar disorder, current episode depressed, moderate (HCC) Active Problems:   GAD (generalized anxiety disorder)   Social anxiety disorder   Polysubstance abuse (HCC)   Current Medications:  Current Facility-Administered Medications  Medication Dose Route Frequency Provider Last Rate Last Admin   acetaminophen (TYLENOL) tablet 650 mg  650 mg Oral Q6H PRN Ophelia Shoulder E, NP   650 mg at 08/27/22 0826   alum & mag hydroxide-simeth (MAALOX/MYLANTA) 200-200-20 MG/5ML suspension 30 mL  30 mL Oral Q4H PRN Chales Abrahams, NP       FLUoxetine (PROZAC) capsule 40 mg  40 mg Oral Daily Ophelia Shoulder E, NP   40 mg at 08/27/22 4098   gabapentin (NEURONTIN) capsule 300 mg  300 mg Oral TID Chales Abrahams, NP   300 mg at 08/27/22 0827   influenza vac split quadrivalent PF (FLUARIX) injection 0.5 mL  0.5 mL Intramuscular Tomorrow-1000 Massengill, Harrold Donath, MD       lamoTRIgine (LAMICTAL) tablet 25 mg  25 mg Oral QHS Onuoha, Chinwendu V, NP   25 mg at 08/26/22 2126   magnesium hydroxide (MILK OF MAGNESIA) suspension 30 mL  30 mL Oral Daily PRN Chales Abrahams, NP       nicotine (NICODERM CQ - dosed in mg/24 hours) patch 14 mg  14 mg Transdermal Daily Massengill, Harrold Donath, MD   14 mg at 08/27/22 0827   norethindrone-ethinyl estradiol-iron (ESTROSTEP FE) 1-20/1-30/1-35 MG-MCG tablet 1 tablet  1 tablet Oral Daily Ophelia Shoulder E, NP       QUEtiapine (SEROQUEL) tablet 100 mg  100 mg Oral QHS Massengill, Harrold Donath, MD   100 mg at 08/26/22 2126   traZODone (DESYREL) tablet 50 mg  50 mg Oral QHS PRN Chales Abrahams, NP       PTA Medications: Medications Prior to Admission  Medication Sig Dispense Refill Last Dose   FLUoxetine (PROZAC) 40 MG capsule Take 1 capsule (40 mg  total) by mouth every morning. 30 capsule 0    gabapentin (NEURONTIN) 300 MG capsule Take 1 capsule (300 mg total) by mouth 2 (two) times daily. Thirty days bridge supply given by Covering MD. Patient must established care with new provider. 60 capsule 0    lamoTRIgine (LAMICTAL) 100 MG tablet Take 100 mg by mouth daily.      norethindrone-ethinyl estradiol-iron (ESTROSTEP FE) 1-20/1-30/1-35 MG-MCG tablet Take 1 tablet by mouth daily. (Patient not taking: Reported on 07/19/2022) 28 tablet 5    QUEtiapine (SEROQUEL) 100 MG tablet Take 1 tablet (100 mg total) by mouth at bedtime. (Patient taking differently: Take 100 mg by mouth at bedtime as needed (sleep).) 30 tablet 0     Patient Stressors:    Patient Strengths:    Treatment Modalities: Medication Management, Group therapy, Case management,  1 to 1 session with clinician, Psychoeducation, Recreational therapy.   Physician Treatment Plan for Primary Diagnosis: Bipolar disorder, current episode depressed, moderate (HCC) Long Term Goal(s): Improvement in symptoms so as ready for discharge   Short Term Goals: Ability to identify changes in lifestyle to reduce recurrence of condition will improve Ability to verbalize feelings will improve Ability to disclose and discuss suicidal ideas Ability to demonstrate self-control will improve Ability to identify and develop effective coping behaviors will improve Ability to maintain clinical measurements within normal limits  will improve Compliance with prescribed medications will improve Ability to identify triggers associated with substance abuse/mental health issues will improve  Medication Management: Evaluate patient's response, side effects, and tolerance of medication regimen.  Therapeutic Interventions: 1 to 1 sessions, Unit Group sessions and Medication administration.  Evaluation of Outcomes: Progressing  Physician Treatment Plan for Secondary Diagnosis: Principal Problem:   Bipolar  disorder, current episode depressed, moderate ([place]) Active Problems:   GAD (generalized anxiety disorder)   Social anxiety disorder   Polysubstance abuse ([place])  Long Term Goal(s): Improvement in symptoms so as ready for discharge   Short Term Goals: Ability to identify changes in lifestyle to reduce recurrence of condition will improve Ability to verbalize feelings will improve Ability to disclose and discuss suicidal ideas Ability to demonstrate self-control will improve Ability to identify and develop effective coping behaviors will improve Ability to maintain clinical measurements within normal limits will improve Compliance with prescribed medications will improve Ability to identify triggers associated with substance abuse/mental health issues will improve     Medication Management: Evaluate patient's response, side effects, and tolerance of medication regimen.  Therapeutic Interventions: 1 to 1 sessions, Unit Group sessions and Medication administration.  Evaluation of Outcomes: Progressing   RN Treatment Plan for Primary Diagnosis: Bipolar disorder, current episode depressed, moderate ([place]) Long Term Goal(s): Knowledge of disease and therapeutic regimen to maintain health will improve  Short Term Goals: Ability to remain free from injury will improve, Ability to verbalize frustration and anger appropriately will improve, Ability to demonstrate self-control, Ability to participate in decision making will improve, Ability to verbalize feelings will improve, Ability to disclose and discuss suicidal ideas, Ability to identify and develop effective coping behaviors will improve, and Compliance with prescribed medications will improve  Medication Management: RN will administer medications as ordered by provider, will assess and evaluate patient's response and provide education to patient for prescribed medication. RN will report any adverse and/or side effects to prescribing  provider.  Therapeutic Interventions: 1 on 1 counseling sessions, Psychoeducation, Medication administration, Evaluate responses to treatment, Monitor vital signs and CBGs as ordered, Perform/monitor CIWA, COWS, AIMS and Fall Risk screenings as ordered, Perform wound care treatments as ordered.  Evaluation of Outcomes: Progressing   LCSW Treatment Plan for Primary Diagnosis: Bipolar disorder, current episode depressed, moderate ([place]) Long Term Goal(s): Safe transition to appropriate next level of care at discharge, Engage patient in therapeutic group addressing interpersonal concerns.  Short Term Goals: Engage patient in aftercare planning with referrals and resources, Increase social support, Increase ability to appropriately verbalize feelings, Increase emotional regulation, Facilitate acceptance of mental health diagnosis and concerns, Facilitate patient progression through stages of change regarding substance use diagnoses and concerns, and Identify triggers associated with mental health/substance abuse issues  Therapeutic Interventions: Assess for all discharge needs, 1 to 1 time with Social worker, Explore available resources and support systems, Assess for adequacy in community support network, Educate family and significant other(s) on suicide prevention, Complete Psychosocial Assessment, Interpersonal group therapy.  Evaluation of Outcomes: Progressing   Progress in Treatment: Attending groups: Yes. Participating in groups: Yes. Taking medication as prescribed: Yes. Toleration medication: Yes. Family/Significant other contact made: Yes, individual(s) contacted:  Denaisha Eiden [phone]  Patient understands diagnosis: Yes. Discussing patient identified problems/goals with staff: Yes. Medical problems stabilized or resolved: Yes. Denies suicidal/homicidal ideation: No. Issues/concerns per patient self-inventory: Yes. Other: none  New problem(s) identified: No, Describe:   none  New Short Term/Long Term Goal(s): Patient to work towards detox, medication  management for mood stabilization; elimination of SI thoughts; development of comprehensive mental wellness/sobriety plan.  Patient Goals: Patient states their goal for treatment is to "medication stabilization."   Discharge Plan or Barriers: No psychosocial barriers identified at this time, patient to return to place of residence when appropriate for discharge.   Reason for Continuation of Hospitalization: Depression Other; describe active substance use   Estimated Length of Stay: 1-7 days   Last 3 [place] Suicide Severity Risk Score: [place] Admission (Current) from 08/25/2022 in [facility] Most recent reading at 08/25/2022  4:00 PM ED from 08/24/2022 in [facility] Most recent reading at 08/24/2022 11:11 PM OP Visit from 08/24/2022 in [facility] Most recent reading at 08/24/2022  7:42 PM  C-SSRS RISK CATEGORY Low Risk High Risk Moderate Risk       Last PHQ 2/9 Scores:    07/19/2022   11:43 AM 03/11/2022    1:55 PM 11/02/2021   10:33 AM  Depression screen PHQ 2/9  Decreased Interest 1 1 2   Down, Depressed, Hopeless 2 1 2   PHQ - 2 Score 3 2 4   Altered sleeping 1 2 2   Tired, decreased energy 1 2 2   Change in appetite 2 1 2   Feeling bad or failure about yourself  2 2 2   Trouble concentrating 3 2 2   Moving slowly or fidgety/restless 0 1 1  Suicidal thoughts 1 2 0  PHQ-9 Score 13 14 15   Difficult doing work/chores Somewhat difficult Somewhat difficult Not difficult at all    Scribe for Treatment Team: Larose Kells 08/27/2022 12:56 PM

## 2022-08-27 NOTE — Progress Notes (Signed)
Pt visible at medication window on initial contact. Reports she slept well with fair appetite. Denies SI, HI, AVH and pain when assessed. Rates her anxiety and depression both 3/10 "just not being able to follow up with treatment". However she did report some improvement in her mood "I feel less hopeless though". Agreed to have labs done this evening. Compliant with medications when offered, denies adverse drug reactions. Attended and participated in scheduled groups with increased verbal prompts. Emotional support, reassurance and encouragement provided to pt. Safety checks maintained at Q 15 minutes intervals without self harm gestures /outburst. Verbal education done on current treatment regimen, effects monitored. Pt tolerates all meals and fluids well when offered.

## 2022-08-27 NOTE — Progress Notes (Signed)
[facility] LCSW Progress Note   08/27/2022    2:57 PM  Lori Hull   [id number]   Type of Contact and Topic: Placement   Patient has been accepted to [facility] for Tuesday 10/10 at  0900 AM.     Signed:  Durenda Hurt, MSW, LCSWA, LCAS 08/27/2022 2:57 PM

## 2022-08-27 NOTE — Progress Notes (Signed)
   08/27/22 2030  Psych Admission Type (Psych Patients Only)  Admission Status Voluntary  Psychosocial Assessment  Patient Complaints Anxiety  Eye Contact Brief  Facial Expression Fixed smile  Affect Appropriate to circumstance  Speech Logical/coherent  Interaction Assertive  Motor Activity Slow  Appearance/Hygiene Unremarkable  Behavior Characteristics Cooperative  Mood Anxious  Aggressive Behavior  Effect No apparent injury  Thought Process  Coherency WDL  Content WDL  Delusions WDL  Perception WDL  Hallucination None reported or observed  Judgment WDL  Confusion WDL  Danger to Self  Current suicidal ideation? Denies  Danger to Others  Danger to Others None reported or observed

## 2022-08-27 NOTE — Progress Notes (Signed)

## 2022-08-27 NOTE — Progress Notes (Signed)
[facility] MD Progress Note  08/27/2022 1:18 PM Lori Hull  MRN:  [id number] Subjective:    Lori Hull is a 21 year old female with a past psychiatric history significant for bipolar disorder, generalized anxiety disorder, social anxiety disorder, panic disorder, and ADHD was admitted to [facility] from [facility] ED due to suicidal ideations and polysubstance abuse.  Patient was seen today by Hilaria Ota for reevaluation. Patient is currently being managed on the following psychiatric medications:  Seroquel 100 mg at bedtime Prozac 40 mg daily Gabapentin 300 mg 3 times daily Lamictal at 25 mg daily  During her reevaluation, patient reports that she is feeling good. Patient denies depression nor does she endorse anxiety at this time. Patient denies any stressors and states that she has gone some group sessions. Patient denies suicidal or homicidal ideations. She further denies auditory or visual hallucinations and does not appear to be responding to internal/external stimuli. She denies paranoia or any delusions. Patient endorses good sleep and good appetite. Patient denies any issues with her current medication regimen.  Principal Problem: Bipolar disorder, current episode depressed, moderate ([place]) Diagnosis: Principal Problem:   Bipolar disorder, current episode depressed, moderate (HCC) Active Problems:   GAD (generalized anxiety disorder)   Social anxiety disorder   Polysubstance abuse ([place])  Total Time spent with patient: 15 minutes  Past Psychiatric History:  Patient reported that she has a past psychiatric history significant for bipolar 2 disorder, depression, panic disorder, social anxiety, and ADHD.  Patient is currently being seen by Dr. Margaretha Seeds at [place].  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HCC)    Seizures  ([place])     Past Surgical History:  Procedure Laterality Date   ENDARTERECTOMY FEMORAL Left 10/01/2019   Procedure: Left Femoral Endarterectomy with Patch Angioplasty;  Surgeon: Elam Dutch, MD;  Location: [facility] OR;  Service: Vascular;  Laterality: Left;   THORACIC AORTIC ENDOVASCULAR STENT GRAFT N/A 10/01/2019   Procedure: THORACIC AORTIC ENDOVASCULAR STENT GRAFT;  Surgeon: Elam Dutch, MD;  Location: [facility] OR;  Service: Vascular;  Laterality: N/A;   Family History:  Family History  Problem Relation Age of Onset   Anxiety disorder Mother    Cancer Mother        breast   Anxiety disorder Brother    Drug abuse Brother    Alcohol abuse Paternal Grandfather    Anxiety disorder Cousin    Heart attack Father    Family Psychiatric  History:  Patient states that her family does not identify with psychiatric illness symptoms  Social History:  Social History   Substance and Sexual Activity  Alcohol Use Not Currently     Social History   Substance and Sexual Activity  Drug Use Not Currently   Types: Marijuana, Heroin, Other-see comments   Comment: Heroin: Last PTA, daily basis     Social History   Socioeconomic History   Marital status: Single    Spouse name: Not on file   Number of children: Not on file   Years of education: Not on file   Highest education level: Not on file  Occupational History   Not on file  Tobacco Use   Smoking status: Every Day    Types: E-cigarettes   Smokeless tobacco: Never   Tobacco comments:  Smoking Jewel pods  Vaping Use   Vaping Use: Former  Substance and Sexual Activity   Alcohol use: Not Currently   Drug use: Not Currently    Types: Marijuana, Heroin, Other-see comments    Comment: Heroin: Last PTA, daily basis    Sexual activity: Yes    Birth control/protection: Implant  Other Topics Concern   Not on file  Social History Narrative   Not on file   Social Determinants of Health   Financial Resource Strain: Not on file   Food Insecurity: Food Insecurity Present (08/25/2022)   Hunger Vital Sign    Worried About Running Out of Food in the Last Year: Often true    Ran Out of Food in the Last Year: Often true  Transportation Needs: No Transportation Needs (08/25/2022)   PRAPARE - Administrator, Civil Service (Medical): No    Lack of Transportation (Non-Medical): No  Physical Activity: Not on file  Stress: Stress Concern Present (02/05/2019)   [employer] of Occupational Health - Occupational Stress Questionnaire    Feeling of Stress : Very much  Social Connections: Not on file   Additional Social History:    Sleep: Good  Appetite:  Good  Current Medications: Current Facility-Administered Medications  Medication Dose Route Frequency Provider Last Rate Last Admin   acetaminophen (TYLENOL) tablet 650 mg  650 mg Oral Q6H PRN Ophelia Shoulder E, NP   650 mg at 08/27/22 0826   alum & mag hydroxide-simeth (MAALOX/MYLANTA) 200-200-20 MG/5ML suspension 30 mL  30 mL Oral Q4H PRN Chales Abrahams, NP       FLUoxetine (PROZAC) capsule 40 mg  40 mg Oral Daily Ophelia Shoulder E, NP   40 mg at 08/27/22 4627   gabapentin (NEURONTIN) capsule 300 mg  300 mg Oral TID Chales Abrahams, NP   300 mg at 08/27/22 0827   influenza vac split quadrivalent PF (FLUARIX) injection 0.5 mL  0.5 mL Intramuscular Tomorrow-1000 Massengill, Harrold Donath, MD       lamoTRIgine (LAMICTAL) tablet 25 mg  25 mg Oral QHS Onuoha, Chinwendu V, NP   25 mg at 08/26/22 2126   magnesium hydroxide (MILK OF MAGNESIA) suspension 30 mL  30 mL Oral Daily PRN Chales Abrahams, NP       nicotine (NICODERM CQ - dosed in mg/24 hours) patch 14 mg  14 mg Transdermal Daily Massengill, Harrold Donath, MD   14 mg at 08/27/22 0827   norethindrone-ethinyl estradiol-iron (ESTROSTEP FE) 1-20/1-30/1-35 MG-MCG tablet 1 tablet  1 tablet Oral Daily Ophelia Shoulder E, NP       QUEtiapine (SEROQUEL) tablet 100 mg  100 mg Oral QHS Massengill, Harrold Donath, MD   100 mg at 08/26/22 2126    traZODone (DESYREL) tablet 50 mg  50 mg Oral QHS PRN Chales Abrahams, NP        Lab Results: No results found for this or any previous visit (from the past 48 hour(s)).  Blood Alcohol level:  Lab Results  Component Value Date   ETH <10 08/25/2022   ETH <10 01/02/2018    Metabolic Disorder Labs: Lab Results  Component Value Date   HGBA1C 5.3 07/19/2022   No results found for: "PROLACTIN" Lab Results  Component Value Date   CHOL 178 07/19/2022   TRIG 63.0 07/19/2022   HDL 73.40 07/19/2022   CHOLHDL 2 07/19/2022   VLDL 12.6 07/19/2022   LDLCALC 92 07/19/2022    Physical Findings: AIMS:  , ,  ,  ,  CIWA:    COWS:     Musculoskeletal: Strength & Muscle Tone: within normal limits Gait & Station: normal Patient leans: N/A  Psychiatric Specialty Exam:  Presentation  General Appearance:  Appropriate for Environment; Casual  Eye Contact: Good  Speech: Clear and Coherent; Normal Rate  Speech Volume: Normal  Handedness: Right   Mood and Affect  Mood: Euthymic  Affect: Appropriate   Thought Process  Thought Processes: Coherent; Goal Directed; Linear  Descriptions of Associations:Intact  Orientation:Full (Time, Place and Person)  Thought Content:WDL  History of Schizophrenia/Schizoaffective disorder:No data recorded Duration of Psychotic Symptoms:No data recorded Hallucinations:Hallucinations: None  Ideas of Reference:None  Suicidal Thoughts:Suicidal Thoughts: No  Homicidal Thoughts:Homicidal Thoughts: No   Sensorium  Memory: Immediate Good; Recent Good; Remote Good  Judgment: Good  Insight: Good   Executive Functions  Concentration: Good  Attention Span: Good  Recall: Good  Fund of Knowledge: Good  Language: Good   Psychomotor Activity  Psychomotor Activity: Psychomotor Activity: Normal   Assets  Assets: Desire for Improvement; Communication Skills; Social Support   Sleep  Sleep: Sleep:  Good    Physical Exam: Physical Exam Psychiatric:        Attention and Perception: Attention and perception normal. She does not perceive auditory or visual hallucinations.        Mood and Affect: Mood and affect normal. Mood is not anxious or depressed.        Speech: Speech normal.        Behavior: Behavior normal. Behavior is cooperative.        Thought Content: Thought content normal. Thought content is not paranoid or delusional. Thought content does not include homicidal or suicidal ideation.        Cognition and Memory: Cognition and memory normal.        Judgment: Judgment is impulsive (Impulsive due to history of drug use).    Review of Systems  Psychiatric/Behavioral:  Positive for substance abuse (History of methamphetamine use). Negative for depression, hallucinations and suicidal ideas. The patient is not nervous/anxious and does not have insomnia.    Blood pressure (!) 114/56, pulse 87, temperature 98.3 F (36.8 C), temperature source Oral, resp. rate 18, height 5\' 8"  (1.727 m), weight 52.7 kg. Body mass index is 17.67 kg/m.   Treatment Plan Summary:  Daily contact with patient to assess and evaluate symptoms and progress in treatment and Medication management  Safety and Monitoring: Voluntary admission to inpatient psychiatric unit for safety, stabilization and treatment Daily contact with patient to assess and evaluate symptoms and progress in treatment Patient's case to be discussed in multi-disciplinary team meeting Observation Level : q15 minute checks Vital signs: q12 hours Precautions: safety   Diagnoses:  Principal Problem:   Bipolar disorder, current episode depressed, moderate (HCC) Active Problems:   GAD (generalized anxiety disorder)   Social anxiety disorder   Polysubstance abuse (HCC)   Bipolar disorder, current episode depressed, moderate -Continue Seroquel 100 mg at bedtime -Continue Prozac 40 mg daily -Continue Lamictal at 25 mg at bedtime.   Lamictal to be titrated up to 50 mg tomorrow.  Lamictal to be titrated fast due to patient taking 100 mg a week ago and being stable on the medication.   Generalized anxiety disorder Social anxiety disorder -Continue Gabapentin 300 mg 3 times daily -Continue Hydroxyzine 25 mg every 6 hours PRN   Polysubstance abuse -UDS positive for cocaine, benzodiazepine, and amphetamine -Social work was notified of patient's interest in residential treatment program. -Social work received  collateral from patient's mother. Patient able to stay with mother once discharged until she is accepted to a residential treatment program.   Other PRNS -Continue Tylenol 650 mg every 6 hours PRN for mild pain -Continue Maalox 30 mg every 4 hrs PRN for indigestion -Continue Milk of Magnesia as needed every 6 hrs for constipation -Continue trazodone 50 mg at bedtime as needed for insomnia    Discharge Planning: Social work and case management to assist with discharge planning and identification of hospital follow-up needs prior to discharge Estimated LOS: 5-7 days Discharge Concerns: Need to establish a safety plan; Medication compliance and effectiveness Discharge Goals: Return home with outpatient referrals for mental health follow-up including medication management/psychotherapy   I certify that inpatient services furnished can reasonably be expected to improve the patient's condition.   Meta Hatchet, PA 08/27/2022, 1:18 PM

## 2022-08-28 ENCOUNTER — Encounter (HOSPITAL_COMMUNITY): Payer: Self-pay | Admitting: Psychiatry

## 2022-08-28 MED ORDER — LAMOTRIGINE 25 MG PO TABS
50.0000 mg | ORAL_TABLET | Freq: Every day | ORAL | Status: DC
  Administered 2022-08-28 – 2022-08-29 (×2): 50 mg via ORAL
  Filled 2022-08-28 (×3): qty 2

## 2022-08-28 MED ORDER — HYDROXYZINE HCL 25 MG PO TABS
25.0000 mg | ORAL_TABLET | Freq: Three times a day (TID) | ORAL | Status: DC | PRN
  Administered 2022-08-30: 25 mg via ORAL
  Filled 2022-08-28 (×2): qty 1

## 2022-08-28 NOTE — Progress Notes (Signed)
   08/28/22 0500  Sleep  Number of Hours 7.5

## 2022-08-28 NOTE — Progress Notes (Signed)
Adult Psychoeducational Group Note  Date:  08/28/2022 Time:  8:37 PM  Group Topic/Focus:  Wrap-Up Group:   The focus of this group is to help patients review their daily goal of treatment and discuss progress on daily workbooks.  Participation Level:  Active  Participation Quality:  Appropriate  Affect:  Appropriate  Cognitive:  Appropriate  Insight: Appropriate  Engagement in Group:  Engaged  Modes of Intervention:  Education and Exploration  Additional Comments:  Patient attended and participated in group tonight. She reports that he most important thing she has learnt today was to direct her energy to be intentional  Debe Coder 08/28/2022, 8:37 PM

## 2022-08-28 NOTE — Progress Notes (Addendum)
   08/28/22 2000  Psych Admission Type (Psych Patients Only)  Admission Status Voluntary  Psychosocial Assessment  Patient Complaints Anxiety;Depression  Eye Contact Fair  Facial Expression Anxious  Affect Appropriate to circumstance  Speech Logical/coherent  Interaction Assertive  Motor Activity Slow  Appearance/Hygiene Unremarkable  Behavior Characteristics Cooperative;Appropriate to situation  Mood Anxious;Depressed;Pleasant  Thought Process  Coherency WDL  Content WDL  Delusions None reported or observed  Perception WDL  Hallucination None reported or observed  Judgment WDL  Confusion None  Danger to Self  Current suicidal ideation? Denies  Danger to Others  Danger to Others None reported or observed   Progress note   D: Pt seen in dayroom. Pt denies SI, HI, AVH. Pt rates pain  0/10. Pt rates anxiety  7/10 and depression  4/10. Pt states she has had an okay day. Says she is ready to leave [facility]. Pt endorses some constipation and anxiety. Says she is anxious about what is happening in the outside world - uncertainty in her relationships, feeling she has no say in them. Also feeling that the relationships aren't meeting her basic needs like housing. She plans on being discharged to her mother so she can pack her things for [facility]. "After [facility], I will be staying with my mom only until I find a job. I was looking for a job when I came here." Pt endorses good appetite and sleep. Pt asked if her third dose of Gabapentin could be given at bedtime instead of dinnertime. Told to mention it to providers in the morning. No other complaints at this time.  A: Pt provided support and encouragement. Pt given scheduled medication as prescribed. PRNs as appropriate. Q15 min checks for safety.   R: Pt safe on the unit. Will continue to monitor.

## 2022-08-28 NOTE — Progress Notes (Signed)
Pt asked for something for anxiety. Provider notified and orders placed.

## 2022-08-28 NOTE — Progress Notes (Signed)
[facility] MD Progress Note  08/28/2022 11:55 AM KEENAN DIMITROV  MRN:  [id number] Subjective:    HPI:  Lori Hull is a 21 year old female with a past psychiatric history significant for bipolar disorder, generalized anxiety disorder, social anxiety disorder, panic disorder, and ADHD was admitted to [facility] from [facility] ED due to suicidal ideations and polysubstance abuse (UDS+amphetamines, benzodiazepines, cocaine).  Assessment: On assessment patient presents in her room laying down eating snack. She is alert and oriented, calm and cooperative. Brightens on approach. Reports feeling "good"; sleeping during the night and has eaten all meals. Currently rates depression 2/10, Anxiety 5/10 based on "aftercare plan". States she is "nervous" about the unknown of the treatment program she's been accepted into [facility]) but states she's "optimistic" about her acceptance. She denies any suicidal or homicidal ideations, auditory or visual hallucinations and does not appear to be responding to internal/external stimuli. She denies paranoia, delusions, or nightmares. She reports ongoing cravings for meth; denies any physical symptoms related to craving. She reports speaking to her mother and conversation "went well, a lot better than expected". Per chart review she has been present for group therapy and meals. Medication compliant with the exception of birth control. Slept 7.5 hours overnight.   Principal Problem: Bipolar disorder, current episode depressed, moderate (HCC) Diagnosis: Principal Problem:   Bipolar disorder, current episode depressed, moderate (HCC) Active Problems:   GAD (generalized anxiety disorder)   Social anxiety disorder   Polysubstance abuse (HCC)  Total Time spent with patient: 15 minutes  Past Psychiatric History:  Patient reported that she has a past psychiatric history significant for bipolar 2 disorder, depression, panic disorder, social anxiety, and  ADHD.  Patient is currently being seen by Dr. Rickard Rhymes at [facility].  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HCC)    Seizures (HCC)     Past Surgical History:  Procedure Laterality Date   ENDARTERECTOMY FEMORAL Left 10/01/2019   Procedure: Left Femoral Endarterectomy with Patch Angioplasty;  Surgeon: Sherren Kerns, MD;  Location: [facility] OR;  Service: Vascular;  Laterality: Left;   THORACIC AORTIC ENDOVASCULAR STENT GRAFT N/A 10/01/2019   Procedure: THORACIC AORTIC ENDOVASCULAR STENT GRAFT;  Surgeon: Sherren Kerns, MD;  Location: [facility] OR;  Service: Vascular;  Laterality: N/A;   Family History:  Family History  Problem Relation Age of Onset   Anxiety disorder Mother    Cancer Mother        breast   Anxiety disorder Brother    Drug abuse Brother    Alcohol abuse Paternal Grandfather    Anxiety disorder Cousin    Heart attack Father    Family Psychiatric  History:  Patient states that her family does not identify with psychiatric illness symptoms  Social History:  Social History   Substance and Sexual Activity  Alcohol Use Not Currently     Social History   Substance and Sexual Activity  Drug Use Not Currently   Types: Marijuana, Heroin, Other-see comments   Comment: Heroin: Last PTA, daily basis     Social History   Socioeconomic History   Marital status: Single    Spouse name: Not on file   Number of children: Not on file   Years of education: Not on file   Highest education level: Not on file  Occupational History   Not on file  Tobacco Use   Smoking status: Every Day    Types: E-cigarettes   Smokeless tobacco: Never   Tobacco comments:    Smoking Jewel pods  Vaping Use   Vaping Use: Former  Substance and Sexual Activity   Alcohol use: Not Currently   Drug use: Not Currently    Types: Marijuana, Heroin, Other-see comments     Comment: Heroin: Last PTA, daily basis    Sexual activity: Yes    Birth control/protection: Implant  Other Topics Concern   Not on file  Social History Narrative   Not on file   Social Determinants of Health   Financial Resource Strain: Not on file  Food Insecurity: Food Insecurity Present (08/25/2022)   Hunger Vital Sign    Worried About Running Out of Food in the Last Year: Often true    Ran Out of Food in the Last Year: Often true  Transportation Needs: No Transportation Needs (08/25/2022)   PRAPARE - Hydrologist (Medical): No    Lack of Transportation (Non-Medical): No  Physical Activity: Not on file  Stress: Stress Concern Present (02/05/2019)   [employer] [place]    Feeling of Stress : Very much  Social Connections: Not on file   Additional Social History:    Sleep: Good  Appetite:  Good  Current Medications: Current Facility-Administered Medications  Medication Dose Route Frequency Provider Last Rate Last Admin   acetaminophen (TYLENOL) tablet 650 mg  650 mg Oral Q6H PRN Mallie Darting, NP   650 mg at 08/27/22 0826   alum & mag hydroxide-simeth (MAALOX/MYLANTA) 200-200-20 MG/5ML suspension 30 mL  30 mL Oral Q4H PRN Mallie Darting, NP       FLUoxetine (PROZAC) capsule 40 mg  40 mg Oral Daily [name] E, NP   40 mg at 08/28/22 0900   gabapentin (NEURONTIN) capsule 300 mg  300 mg Oral TID Mallie Darting, NP   300 mg at 08/28/22 0900   lamoTRIgine (LAMICTAL) tablet 50 mg  50 mg Oral QHS Leevy-Johnson, Dreyson Mishkin A, NP       magnesium hydroxide (MILK OF MAGNESIA) suspension 30 mL  30 mL Oral Daily PRN [name] E, NP       nicotine (NICODERM CQ - dosed in mg/24 hours) patch 14 mg  14 mg Transdermal Daily Massengill, Nathan, MD   14 mg at 08/28/22 0900   norethindrone-ethinyl estradiol-iron (ESTROSTEP FE) 1-20/1-30/1-35 MG-MCG tablet 1 tablet  1 tablet Oral Daily [name] E, NP        QUEtiapine (SEROQUEL) tablet 100 mg  100 mg Oral QHS Massengill, Ovid Curd, MD   100 mg at 08/27/22 2123   traZODone (DESYREL) tablet 50 mg  50 mg Oral QHS PRN Mallie Darting, NP        Lab Results: No results found for this or any previous visit (from the past 48 hour(s)).  Blood Alcohol level:  Lab Results  Component Value Date   ETH <10 08/25/2022   ETH <10 91/47/8295    Metabolic Disorder Labs: Lab Results  Component Value Date   HGBA1C 5.3 07/19/2022   No results found for: "PROLACTIN" Lab Results  Component Value Date   CHOL 178 07/19/2022   TRIG 63.0 07/19/2022   HDL 73.40 07/19/2022   CHOLHDL 2 07/19/2022   VLDL 12.6 07/19/2022   [place] 92 07/19/2022    Physical Findings:  AIMS:  , ,  ,  ,    CIWA:    COWS:     Musculoskeletal: Strength & Muscle Tone: within normal limits Gait & Station: normal Patient leans: N/A  Psychiatric Specialty Exam:  Presentation  General Appearance:  Casual; Appropriate for Environment  Eye Contact: Good  Speech: Clear and Coherent  Speech Volume: Normal  Handedness: Right  Mood and Affect  Mood: Euthymic  Affect: Appropriate  Thought Process  Thought Processes: Goal Directed; Coherent  Descriptions of Associations:Intact  Orientation:Full (Time, Place and Person)  Thought Content:Logical; WDL  History of Schizophrenia/Schizoaffective disorder:No data recorded Duration of Psychotic Symptoms:No data recorded Hallucinations:Hallucinations: None  Ideas of Reference:None  Suicidal Thoughts:Suicidal Thoughts: No  Homicidal Thoughts:Homicidal Thoughts: No  Sensorium  Memory: Immediate Good; Recent Good  Judgment: Intact  Insight: Good  Executive Functions  Concentration: Good  Attention Span: Good  Recall: Good  Fund of Knowledge: Good  Language: Good  Psychomotor Activity  Psychomotor Activity: Psychomotor Activity: Normal  Assets  Assets: Resilience; Social Support;  Physical Health; Financial Resources/Insurance; Desire for Improvement; Communication Skills; Vocational/Educational  Sleep  Sleep: Sleep: Good  Physical Exam: Physical Exam Vitals and nursing note reviewed.  Constitutional:      Appearance: She is normal weight.  HENT:     Head: Normocephalic.     Nose: Nose normal.     Mouth/Throat:     Mouth: Mucous membranes are moist.     Pharynx: Oropharynx is clear.  Eyes:     Pupils: Pupils are equal, round, and reactive to light.  Cardiovascular:     Rate and Rhythm: Normal rate.     Pulses: Normal pulses.  Pulmonary:     Effort: Pulmonary effort is normal.  Abdominal:     Palpations: Abdomen is soft.  Musculoskeletal:        General: Normal range of motion.     Cervical back: Normal range of motion.  Skin:    General: Skin is dry.  Neurological:     Mental Status: She is alert and oriented to person, place, and time.  Psychiatric:        Attention and Perception: Attention and perception normal. She does not perceive auditory or visual hallucinations.        Mood and Affect: Mood and affect normal. Mood is not anxious or depressed.        Speech: Speech normal.        Behavior: Behavior normal. Behavior is cooperative.        Thought Content: Thought content normal. Thought content is not paranoid or delusional. Thought content does not include homicidal or suicidal ideation.        Cognition and Memory: Cognition and memory normal.        Judgment: Impulsive: Impulsive due to history of drug use.    Review of Systems  Constitutional:  Negative for diaphoresis and malaise/fatigue.  Psychiatric/Behavioral:  Positive for substance abuse (History of methamphetamine use). Negative for depression, hallucinations and suicidal ideas. The patient is not nervous/anxious and does not have insomnia.   All other systems reviewed and are negative.  Blood pressure 98/73, pulse (!) 110, temperature 97.7 F (36.5 C), temperature source  Oral, resp. rate 17, height 5\' 8"  (1.727 m), weight 52.7 kg, SpO2 99 %. Body mass index is 17.67 kg/m.   Treatment Plan Summary:  Daily contact with patient to assess and evaluate symptoms and progress in treatment and Medication management  Safety and Monitoring: Voluntary admission to  inpatient psychiatric unit for safety, stabilization and treatment Daily contact with patient to assess and evaluate symptoms and progress in treatment Patient's case to be discussed in multi-disciplinary team meeting Observation Level : q15 minute checks Vital signs: q12 hours Precautions: safety   Diagnoses:  Principal Problem:   Bipolar disorder, current episode depressed, moderate (HCC) Active Problems:   GAD (generalized anxiety disorder)   Social anxiety disorder   Polysubstance abuse (HCC)   Bipolar disorder, current episode depressed, moderate -Continue Quetiapine 100 mg at bedtime -Continue Fluoxetine 40 mg daily -Increase Lamictal to 50 mg at bedtime. Lamictal to be titrated fast due to patient taking 100 mg a week ago and being stable on the medication.   Generalized anxiety disorder Social anxiety disorder -Continue Gabapentin 300 mg 3 times daily   Polysubstance abuse -UDS positive for cocaine, benzodiazepine, and amphetamine -Nicotine patch 14 mg Transdermal daily  -Accepted to [facility] Residential treatment program; 08/31/22 admission date - Patient able to stay with mother once discharged until she is accepted to a residential treatment program.   Other PRNS -Continue Tylenol 650 mg every 6 hours PRN for mild pain -Continue Maalox 30 mg every 4 hrs PRN for indigestion -Continue Milk of Magnesia as needed every 6 hrs for constipation -Continue trazodone 50 mg at bedtime as needed for insomnia  Discharge Planning: Social work and case management to assist with discharge planning and identification of hospital follow-up needs prior to discharge. Patient accepted to [facility]  Residential Program for Tuesday 08/31/22 admission date.  Estimated LOS: 5-7 days Discharge Concerns: Need to establish a safety plan; Medication compliance and effectiveness Discharge Goals: Return home with outpatient referrals for mental health follow-up including medication management/psychotherapy   I certify that inpatient services furnished can reasonably be expected to improve the patient's condition.   Loletta Parish, NP 08/28/2022, 11:55 AMPatient ID: Lindi Adie, female   DOB: 05-26-2001, 21 y.o.   MRN: [id number]

## 2022-08-28 NOTE — Progress Notes (Signed)
D. Pt has been calm and cooperative on the unit -rated her depression,hopelessness and anxiety a 6/4/9, respectively. Pt attended one group this afternoon, but stayed in her bed for much of the morning reporting that she was tired because she was "withdrawing from meth". Pt denied SI /HI and AVH .  A. Labs and vitals monitored. Pt given and educated on medications. Pt supported emotionally and encouraged to express concerns and ask questions.   R. Pt remains safe with 15 minute checks. Will continue POC.

## 2022-08-28 NOTE — [facility] Group Notes (Signed)
.  Psychoeducational Group Note    Date:  08/28/2022 Time: 1300-1400    Purpose of Group: . The group focus' on teaching patients on how to identify their needs and their Life Skills:  A group where two lists are made. What people need and what are things that we do that are unhealthy. The lists are developed by the patients and it is explained that we often do the actions that are not healthy to get our list of needs met.  Goal:: to develop the coping skills needed to get their needs met  Participation Level:  Active  Participation Quality:  Appropriate  Affect:  Appropriate  Cognitive:  Oriented  Insight: Improving  Engagement in Group:  Engaged  Modes of Intervention:  Activity, Discussion, Education, and Support  Additional Comments:  Rates energy at a 6/10. Participated in the group.  Paulino Rily

## 2022-08-28 NOTE — B[facility] Group Notes (Signed)
Psychoeducational Group Note  Date: 08/28/22 Time: 0900-1000    Goal Setting   Purpose of Group: This group helps to provide patients with the steps of setting a goal that is specific, measurable, attainable, realistic and time specific. A discussion on how we keep ourselves stuck with negative self talk. Homework given for Patients to write 30 positive attributes about themselves.    Participation Level:  did not attend  Lori Hull

## 2022-08-28 NOTE — Group Note (Signed)
LCSW Group Therapy Note  08/28/2022    10:00-11:00am   Type of Therapy and Topic:  Group Therapy: Early Messages Received About Anger  Participation Level:  Did Not Attend   Description of Group:   In this group, patients shared and discussed the early messages received in their lives about anger through parental or other adult modeling, teaching, repression, punishment, violence, and more.  Participants identified how those childhood lessons influence even now how they usually or often react when angered.  The group discussed that anger is a secondary emotion and what may be the underlying emotional themes that come out through anger outbursts or that are ignored through anger suppression.    Therapeutic Goals: Patients will identify one or more childhood message about anger that they received and how it was taught to them. Patients will discuss how these childhood experiences have influenced and continue to influence their own expression or repression of anger even today. Patients will explore possible primary emotions that tend to fuel their secondary emotion of anger. Patients will learn that anger itself is normal and cannot be eliminated, and that healthier coping skills can assist with resolving conflict rather than worsening situations.  Summary of Patient Progress:  The patient did not attend this group.  Therapeutic Modalities:   Cognitive Behavioral Therapy Motivation Interviewing  [place], [place] 08/28/2022 10:49 AM

## 2022-08-29 DIAGNOSIS — F3132 Bipolar disorder, current episode depressed, moderate: Principal | ICD-10-CM

## 2022-08-29 NOTE — B[facility] Group Notes (Signed)
Adult Psychoeducational Group  Date:  08/30/2022 Time:  1300-1400  Group Topic/Focus: Continuation of the group from Saturday. Looking at the lists that were created and talking about what needs to be done with the homework of 30 positives about themselves.                                     Talking about taking their power back and helping themselves to develop a positive self esteem.      Participation Quality:  did not attend  Arby Dahir A  

## 2022-08-29 NOTE — B[facility] Group Notes (Signed)
Adult Psychoeducational Group Note Date:  08/29/2022 Time:  1100-1130 Group Topic/Focus: PROGRESSIVE RELAXATION. A group where deep breathing is taught and tensing and relaxation muscle groups is used. Imagery is used as well.  Pts are asked to imagine 3 pillars that hold them up when they are not able to hold themselves up and to share that with the group.   Participation Level: did not attend  Additional Comments:     Participation Level: Lori Hull

## 2022-08-29 NOTE — B[facility] Group Notes (Signed)
Psychoeducational Group Patients were given interactive activity in which they were asked to identify 3 signs they were not compensating well in regards to their mental health. Patients were then asked to recognize three things that fuel them, and help them cope with their mental illness. Pt attended and was appropriate.

## 2022-08-29 NOTE — Progress Notes (Signed)
[facility] MD Progress Note  08/29/2022 12:17 PM Lori Hull  MRN:  [id number] Subjective:    HPI:  Lori Hull is a 21 year old female with a past psychiatric history significant for bipolar disorder, generalized anxiety disorder, social anxiety disorder, panic disorder, and ADHD was admitted to [facility] from [facility] ED due to suicidal ideations and polysubstance abuse (UDS+amphetamines, benzodiazepines, cocaine).  Assessment: On assessment patient presents in her room alert and oriented, calm and cooperative. Brightens on approach. She reports feeling "good" and "optimistic" about future and sobriety. Nursing documents patient sleeping during the night (7.5 hrs) and eating meals. Denies any depression today and anxiety as "a little". She denies any suicidal or homicidal ideations, auditory or visual hallucinations and does not appear to be responding to internal/external stimuli. She denies paranoia, delusions, or nightmares. She describes cravings for meth as "there but manageable"; denies any physical symptoms related to craving. Per chart review she has been present for majority group therapy and activities. Medication compliant with the exception of birth control; denies any side effects or concerns. Plan to proceed with current discharge plan Tuesday to [facility] program. Denies any concerns.   Principal Problem: Bipolar disorder, current episode depressed, moderate (HCC) Diagnosis: Principal Problem:   Bipolar disorder, current episode depressed, moderate (HCC) Active Problems:   GAD (generalized anxiety disorder)   Social anxiety disorder   Polysubstance abuse (HCC)  Total Time spent with patient: 15 minutes  Past Psychiatric History:  Patient reported that she has a past psychiatric history significant for bipolar 2 disorder, depression, panic disorder, social anxiety, and ADHD.  Patient is currently being seen by Dr. Rickard Rhymes at [facility].  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HCC)    Seizures (HCC)     Past Surgical History:  Procedure Laterality Date   ENDARTERECTOMY FEMORAL Left 10/01/2019   Procedure: Left Femoral Endarterectomy with Patch Angioplasty;  Surgeon: Sherren Kerns, MD;  Location: [facility] OR;  Service: Vascular;  Laterality: Left;   THORACIC AORTIC ENDOVASCULAR STENT GRAFT N/A 10/01/2019   Procedure: THORACIC AORTIC ENDOVASCULAR STENT GRAFT;  Surgeon: Sherren Kerns, MD;  Location: [facility] OR;  Service: Vascular;  Laterality: N/A;   Family History:  Family History  Problem Relation Age of Onset   Anxiety disorder Mother    Cancer Mother        breast   Anxiety disorder Brother    Drug abuse Brother    Alcohol abuse Paternal Grandfather    Anxiety disorder Cousin    Heart attack Father    Family Psychiatric  History:  Patient states that her family does not identify with psychiatric illness symptoms  Social History:  Social History   Substance and Sexual Activity  Alcohol Use Not Currently     Social History   Substance and Sexual Activity  Drug Use Not Currently   Types: Marijuana, Heroin, Other-see comments   Comment: Heroin: Last PTA, daily basis     Social History   Socioeconomic History   Marital status: Single    Spouse name: Not on file   Number of children: Not on file   Years of education: Not on file   Highest education level: Not on file  Occupational History   Not on file  Tobacco Use   Smoking  status: Every Day    Types: E-cigarettes   Smokeless tobacco: Never   Tobacco comments:    Smoking Jewel pods  Vaping Use   Vaping Use: Former  Substance and Sexual Activity   Alcohol use: Not Currently   Drug use: Not Currently    Types: Marijuana, Heroin, Other-see comments    Comment: Heroin: Last PTA, daily basis    Sexual activity: Yes    Birth  control/protection: Implant  Other Topics Concern   Not on file  Social History Narrative   Not on file   Social Determinants of Health   Financial Resource Strain: Not on file  Food Insecurity: Food Insecurity Present (08/25/2022)   Hunger Vital Sign    Worried About Running Out of Food in the Last Year: Often true    Ran Out of Food in the Last Year: Often true  Transportation Needs: No Transportation Needs (08/25/2022)   PRAPARE - Hydrologist (Medical): No    Lack of Transportation (Non-Medical): No  Physical Activity: Not on file  Stress: Stress Concern Present (02/05/2019)   [employer] [place]    Feeling of Stress : Very much  Social Connections: Not on file   Additional Social History:    Sleep: Good  Appetite:  Good  Current Medications: Current Facility-Administered Medications  Medication Dose Route Frequency Provider Last Rate Last Admin   acetaminophen (TYLENOL) tablet 650 mg  650 mg Oral Q6H PRN [name] E, NP   650 mg at 08/27/22 0826   alum & mag hydroxide-simeth (MAALOX/MYLANTA) 200-200-20 MG/5ML suspension 30 mL  30 mL Oral Q4H PRN Mallie Darting, NP       FLUoxetine (PROZAC) capsule 40 mg  40 mg Oral Daily [name] E, NP   40 mg at 08/29/22 6387   gabapentin (NEURONTIN) capsule 300 mg  300 mg Oral TID Mallie Darting, NP   300 mg at 08/29/22 0930   hydrOXYzine (ATARAX) tablet 25 mg  25 mg Oral TID PRN Ajibola, Ene A, NP       lamoTRIgine (LAMICTAL) tablet 50 mg  50 mg Oral QHS Leevy-Johnson, Avari Gelles A, NP   50 mg at 08/28/22 2120   magnesium hydroxide (MILK OF MAGNESIA) suspension 30 mL  30 mL Oral Daily PRN Mallie Darting, NP       nicotine (NICODERM CQ - dosed in mg/24 hours) patch 14 mg  14 mg Transdermal Daily Massengill, Ovid Curd, MD   14 mg at 08/29/22 5643   norethindrone-ethinyl estradiol-iron (ESTROSTEP FE) 1-20/1-30/1-35 MG-MCG tablet 1 tablet  1 tablet Oral  Daily [name] E, NP       QUEtiapine (SEROQUEL) tablet 100 mg  100 mg Oral QHS Massengill, Ovid Curd, MD   100 mg at 08/28/22 2120   traZODone (DESYREL) tablet 50 mg  50 mg Oral QHS PRN Mallie Darting, NP        Lab Results: No results found for this or any previous visit (from the past 48 hour(s)).  Blood Alcohol level:  Lab Results  Component Value Date   ETH <10 08/25/2022   ETH <10 32/95/1884    Metabolic Disorder Labs: Lab Results  Component Value Date   HGBA1C 5.3 07/19/2022   No results found for: "PROLACTIN" Lab Results  Component Value Date   CHOL 178 07/19/2022   TRIG 63.0 07/19/2022   HDL 73.40 07/19/2022   CHOLHDL 2 07/19/2022   VLDL 12.6  07/19/2022   LDLCALC 92 07/19/2022    Physical Findings: AIMS:  , ,  ,  ,    CIWA:    COWS:     Musculoskeletal: Strength & Muscle Tone: within normal limits Gait & Station: normal Patient leans: N/A  Psychiatric Specialty Exam:  Presentation  General Appearance:  Casual; Appropriate for Environment  Eye Contact: Good  Speech: Clear and Coherent  Speech Volume: Normal  Handedness: Right  Mood and Affect  Mood: Euthymic  Affect: Appropriate  Thought Process  Thought Processes: Goal Directed; Coherent  Descriptions of Associations:Intact  Orientation:Full (Time, Place and Person)  Thought Content:Logical; WDL  History of Schizophrenia/Schizoaffective disorder:No data recorded Duration of Psychotic Symptoms:No data recorded Hallucinations:Hallucinations: None  Ideas of Reference:None  Suicidal Thoughts:Suicidal Thoughts: No  Homicidal Thoughts:Homicidal Thoughts: No  Sensorium  Memory: Immediate Good; Recent Good  Judgment: Intact  Insight: Good  Executive Functions  Concentration: Good  Attention Span: Good  Recall: Good  Fund of Knowledge: Good  Language: Good  Psychomotor Activity  Psychomotor Activity: Psychomotor Activity: Normal  Assets   Assets: Social Support; Resilience; Physical Health; Desire for Improvement; Communication Skills; Vocational/Educational  Sleep  Sleep: Sleep: Good  Physical Exam: Physical Exam Vitals and nursing note reviewed.  Constitutional:      Appearance: She is normal weight.  HENT:     Head: Normocephalic.     Nose: Nose normal.     Mouth/Throat:     Mouth: Mucous membranes are moist.     Pharynx: Oropharynx is clear.  Eyes:     Pupils: Pupils are equal, round, and reactive to light.  Cardiovascular:     Rate and Rhythm: Normal rate.     Pulses: Normal pulses.  Pulmonary:     Effort: Pulmonary effort is normal.  Abdominal:     Palpations: Abdomen is soft.  Musculoskeletal:        General: Normal range of motion.     Cervical back: Normal range of motion.  Skin:    General: Skin is dry.  Neurological:     Mental Status: She is alert and oriented to person, place, and time.  Psychiatric:        Attention and Perception: Attention and perception normal. She does not perceive auditory or visual hallucinations.        Mood and Affect: Mood and affect normal. Mood is not anxious or depressed.        Speech: Speech normal.        Behavior: Behavior normal. Behavior is cooperative.        Thought Content: Thought content normal. Thought content is not paranoid or delusional. Thought content does not include homicidal or suicidal ideation.        Cognition and Memory: Cognition and memory normal.        Judgment: Judgment normal. Impulsive: Impulsive due to history of drug use.    Review of Systems  Constitutional:  Negative for diaphoresis and malaise/fatigue.  Psychiatric/Behavioral:  Positive for substance abuse (History of methamphetamine use). Negative for depression, hallucinations and suicidal ideas. The patient is not nervous/anxious and does not have insomnia.   All other systems reviewed and are negative.  Blood pressure 121/81, pulse 87, temperature 98.5 F (36.9 C),  temperature source Oral, resp. rate 16, height 5\' 8"  (1.727 m), weight 52.7 kg, SpO2 99 %. Body mass index is 17.67 kg/m.   Treatment Plan Summary:  Daily contact with patient to assess and evaluate symptoms and progress in treatment  and Medication management  Safety and Monitoring: Voluntary admission to inpatient psychiatric unit for safety, stabilization and treatment Daily contact with patient to assess and evaluate symptoms and progress in treatment Patient's case to be discussed in multi-disciplinary team meeting Observation Level : q15 minute checks Vital signs: q12 hours Precautions: safety   Diagnoses:  Principal Problem:   Bipolar disorder, current episode depressed, moderate (HCC) Active Problems:   GAD (generalized anxiety disorder)   Social anxiety disorder   Polysubstance abuse (HCC)   Bipolar disorder, current episode depressed, moderate -Continue: - Quetiapine 100 mg at bedtime - Fluoxetine 40 mg daily - Lamictal to 50 mg at bedtime. Lamictal to be titrated fast  due to patient taking 100 mg a week ago and  being stable on the medication. Increased 08/28/22   Generalized anxiety disorder Social anxiety disorder -Continue: - Gabapentin 300 mg 3 times daily   Polysubstance abuse -UDS positive for cocaine, benzodiazepine, and amphetamine -Nicotine patch 14 mg Transdermal daily  -Accepted to [facility] Residential treatment program; 08/31/22 admission date - Patient able to stay with mother once discharged until she is accepted to a residential treatment program.   Other PRNS -Continue: - Tylenol 650 mg every 6 hours PRN for mild pain - Maalox 30 mg every 4 hrs PRN for indigestion - Milk of Magnesia as needed every 6 hrs for constipation - Trazodone 50 mg at bedtime as needed for insomnia  Discharge Planning: Social work and case management to assist with discharge planning and identification of hospital follow-up needs prior to discharge. Patient accepted  to [facility] for Tuesday 08/31/22 admission date.  Estimated LOS: 5-7 days Discharge Concerns: Need to establish a safety plan; Medication compliance and effectiveness Discharge Goals: Return home with outpatient referrals for mental health follow-up including medication management/psychotherapy   I certify that inpatient services furnished can reasonably be expected to improve the patient's condition.   Loletta Parish, NP 08/29/2022, 12:17 PMPatient ID: Lori Hull, female   DOB: 01/27/2001, 21 y.o.   MRN: [id number] Patient ID: Lori Hull, female   DOB: October 08, 2001, 21 y.o.   MRN: [id number]

## 2022-08-29 NOTE — Progress Notes (Signed)
Adult Psychoeducational Group Note  Date:  08/29/2022 Time:  8:25 PM  Group Topic/Focus:  Wrap-Up Group:   The focus of this group is to help patients review their daily goal of treatment and discuss progress on daily workbooks.  Participation Level:  Active  Participation Quality:  Appropriate  Affect:  Appropriate  Cognitive:  Appropriate  Insight: Appropriate  Engagement in Group:  Engaged  Modes of Intervention:  Education and Exploration  Additional Comments:  Patient attended and participated in group tonight.She reports that she likes her creativity.  Salley [facility] 08/29/2022, 8:25 PM

## 2022-08-29 NOTE — Progress Notes (Signed)
Pt. Visible on unit, gait steady, denies somatic complaints, breathing unlabored and easy.  Appears with anxious affect, eye contact is appropriate, no psychomotor abnormalities noted. Denies SI/HI/AV. Discussed anxiety regarding anticipated discharge.  Relates wish for imminent discharge in order to collect all of her belongings as she is unsure of where they are and if they are intact.  Processed fears of loss and feelings of helplessness. Encouraged to utilize time left in hospital to consider ways in which to establish control and address fear of loss.

## 2022-08-29 NOTE — Group Note (Signed)
LCSW Group Therapy Note   Group Date: 04/07/2022 Start Time: 1300 End Time: 1400   Type of Therapy and Topic:  Group Therapy: Wise Mind  Participation Level:  Did not attend  Description of Group: Group members were presented with topic about wise mind, reasonable mind, and emotional mind.  Group members asked to identify situations were they used their wise mind, reasonable mind and emotional mind.  Members asked to identify how behaviors affected them after using parts of their mind and identified alternative behaviors they can use when they are in crisis.    Therapeutic Goals:  1. Patients will identify consequences of using emotional mind and rational mind.  2. Patients will engage in discussion on how they cope when they are in crisis 3.  Patients will discuss coping mechanisms to engage their wise mind     Summary of Patient Progress:    Did not attend  Therapeutic Modalities:  DBT Solution focused therapy    Rehman Levinson E Anastashia Westerfeld, LCSW

## 2022-08-29 NOTE — Progress Notes (Signed)
   08/29/22 0534  Sleep  Number of Hours 7.5

## 2022-08-30 MED ORDER — LAMOTRIGINE 100 MG PO TABS: 100.0000 mg | ORAL_TABLET | Freq: Every day | ORAL | 0 refills | Status: DC

## 2022-08-30 MED ORDER — LAMOTRIGINE 100 MG PO TABS
100.0000 mg | ORAL_TABLET | Freq: Every day | ORAL | Status: DC
  Administered 2022-08-30: 100 mg via ORAL
  Filled 2022-08-30 (×2): qty 1
  Filled 2022-08-30: qty 14

## 2022-08-30 MED ORDER — GABAPENTIN 300 MG PO CAPS: 300.0000 mg | ORAL_CAPSULE | Freq: Two times a day (BID) | ORAL | 0 refills | Status: DC

## 2022-08-30 MED ORDER — FLUOXETINE HCL 40 MG PO CAPS: 40.0000 mg | ORAL_CAPSULE | Freq: Every morning | ORAL | 0 refills | Status: AC

## 2022-08-30 MED ORDER — QUETIAPINE FUMARATE 100 MG PO TABS: 100.0000 mg | ORAL_TABLET | Freq: Every day | ORAL | 0 refills | Status: AC

## 2022-08-30 NOTE — Progress Notes (Signed)
   08/30/22 1945  Psych Admission Type (Psych Patients Only)  Admission Status Voluntary  Psychosocial Assessment  Patient Complaints Anxiety;Depression  Eye Contact Fair  Facial Expression Anxious  Affect Appropriate to circumstance  Speech Logical/coherent  Interaction Assertive  Motor Activity Other (Comment) (wnl)  Appearance/Hygiene Unremarkable  Behavior Characteristics Cooperative;Appropriate to situation  Mood Anxious  Thought Process  Coherency WDL  Content WDL  Delusions None reported or observed  Perception WDL  Hallucination None reported or observed  Judgment WDL  Confusion None  Danger to Self  Current suicidal ideation? Denies  Danger to Others  Danger to Others None reported or observed   Progress note   D: Pt seen in milieu. Pt denies SI, HI, AVH. Pt rates pain  0/10. Pt rates anxiety  7/10 and depression  4/10. Pt states that she is ready to go to [facility] tomorrow but also anxious. Pt is discharging straight to [facility]. Pt wanted to get picked up by her mother and get her clothing but will have to find another solution to get her things. Pt is anxious about that. Pt endorses good sleep and appetite. Went to group this evening. No other complaints noted at this time.  A: Pt provided support and encouragement. Pt given scheduled medication as prescribed. PRNs as appropriate. Q15 min checks for safety.   R: Pt safe on the unit. Will continue to monitor.

## 2022-08-30 NOTE — Progress Notes (Signed)
  [facility] Adult Case Management Discharge Plan :  Will you be returning to the same living situation after discharge:  No. At discharge, do you have transportation home?: Yes,  [employer] Do you have the ability to pay for your medications: No.  Release of information consent forms completed and in the chart;  Patient's signature needed at discharge.  Patient to Follow up at:  [place]. Schedule an appointment as soon as possible for a visit.   Specialty: Behavioral Health Why: Please call or go to this provider to schedule an assessment, to obtain therapy and medication management services; on a Monday or a Wednesday morning, arrive by 7:30 am.  Services are provided on a first come, first served basis.  Once you have scheduled and completed your assessment, appointments will be scheduled for therapy and medication management. Contact information: [place] [phone]        Services, [facility] Follow up on 08/31/2022.   Why: Referral made. Patient has been accepted to inpatient treatment and will report for intake at 900am Contact information: [place] [address] [phone]                 Next level of care provider has access to [place] and Suicide Prevention discussed: Yes,  K[place]anna Kasprzak (Mom) [phone], Patient will going to treatment at [facility]  [place]      Has patient been referred to the Quitline?: N/A patient is not a smoker  Patient has been referred for addiction treatment: Yes, [facility] inpatient program Patient to continue working towards treatment goals after discharge. Patient no longer meets criteria for inpatient criteria per attending physician. Continue taking medications as prescribed, nursing to provide instructions at discharge. Follow up with all scheduled appointments.     [place], LCSW 08/30/2022, 3:31 PM

## 2022-08-30 NOTE — Progress Notes (Signed)
Pt denied SI/HI/AVH this morning. Pt rates her anxiety a 5/10 and her depression a 4/10. Pt has been calm and cooperative throughout the day. Pt seen interacting on the milieu.  Pt given scheduled medications as prescribed. Q15 min checks verified for safety. Pt is safe on the unit.    08/30/22 1037  Psych Admission Type (Psych Patients Only)  Admission Status Voluntary  Psychosocial Assessment  Patient Complaints Anxiety  Eye Contact Fair  Facial Expression Anxious;Flat  Affect Appropriate to circumstance  Speech Logical/coherent  Interaction Assertive  Motor Activity Other (Comment) (WDL)  Appearance/Hygiene Unremarkable  Behavior Characteristics Appropriate to situation;Cooperative  Mood Anxious  Thought Process  Coherency WDL  Content WDL  Delusions None reported or observed  Perception WDL  Hallucination None reported or observed  Judgment WDL  Confusion None  Danger to Self  Current suicidal ideation? Denies  Description of Agreement Verbal  Danger to Others  Danger to Others None reported or observed

## 2022-08-30 NOTE — Progress Notes (Signed)
Recreation Therapy Notes  Date: 10.9.23 Time: 0930 Location: [address] Dayroom  Group Topic: Stress Management   Goal Area(s) Addresses:  Patient will actively participate in stress management techniques presented during session.  Patient will successfully identify benefit of practicing stress management post d/c.   Intervention: Relaxation exercise with ambient sound and script   Activity: Guided Imagery. LRT provided education, instruction, and demonstration on practice of visualization via guided imagery. Patient was asked to participate in the technique introduced during session. LRT debriefed including topics of mindfulness, stress management and specific scenarios each patient could use these techniques. Patients were given suggestions of ways to access scripts post d/c and encouraged to explore Youtube and other apps available on smartphones, tablets, and computers.  Education:  Stress Management, Discharge Planning.   Education Outcome: Acknowledges education  Clinical Observations/Feedback: Patient did not attend group.    Lori Hull, LRT,CTRS        Lori Hull 08/30/2022 12:29 PM

## 2022-08-30 NOTE — BHH Suicide Risk Assessment (Signed)
Suicide Risk Assessment  Discharge Assessment    [facility] Discharge Suicide Risk Assessment   Principal Problem: Bipolar disorder, current episode depressed, moderate (HCC) Discharge Diagnoses: Principal Problem:   Bipolar disorder, current episode depressed, moderate (HCC) Active Problems:   GAD (generalized anxiety disorder)   Social anxiety disorder   Polysubstance abuse (HCC)  HPI:  Lori Hull is a 21 year old female with a past psychiatric history significant for bipolar disorder, generalized anxiety disorder, social anxiety disorder, panic disorder, and ADHD was admitted to [facility] from [facility] ED due to suicidal ideations and polysubstance abuse.   HOSPITAL COURSE:  During the patient's hospitalization, patient had extensive initial psychiatric evaluation, and follow-up psychiatric evaluations every day.  Psychiatric diagnoses provided upon initial assessment: Bipolar disorder, current episode depressed, moderate  Patient's psychiatric medications were adjusted on admission:  Patient was started on Lamictal 25 mg. Lamictal was titrated fast due to patient taking 100 mg a week prior to admission and was stable on the medication. Patient Lamictal was titrated to 50 mg at bedtime 2 - 3 days later, followed by 100 mg bedtime 2 days later.  During the hospitalization, other adjustments were made to the patient's psychiatric medication regimen: No other adjustments made.  Patient's care was discussed during the interdisciplinary team meeting every day during the hospitalization.  The patient denies having side effects to prescribed psychiatric medication.  Gradually, patient started adjusting to milieu. The patient was evaluated each day by a clinical provider to ascertain response to treatment. Improvement was noted by the patient's report of decreasing symptoms, improved sleep and appetite, affect, medication tolerance, behavior, and participation in  unit programming.  Patient was asked each day to complete a self inventory noting mood, mental status, pain, new symptoms, anxiety and concerns.    Symptoms were reported as significantly decreased or resolved completely by discharge.   On day of discharge, the patient reports that their mood is stable. The patient denied having suicidal thoughts for more than 48 hours prior to discharge.  Patient denies having homicidal thoughts.  Patient denies having auditory hallucinations.  Patient denies any visual hallucinations or other symptoms of psychosis. The patient was motivated to continue taking medication with a goal of continued improvement in mental health.   The patient reports their target psychiatric symptoms of bipolar depression responded well to the psychiatric medications, and the patient reports overall benefit other psychiatric hospitalization. Supportive psychotherapy was provided to the patient. The patient also participated in regular group therapy while hospitalized. Coping skills, problem solving as well as relaxation therapies were also part of the unit programming.  Labs were reviewed with the patient, and abnormal results were discussed with the patient.  The patient is able to verbalize their individual safety plan to this provider.  # It is recommended to the patient to continue psychiatric medications as prescribed, after discharge from the hospital.    # It is recommended to the patient to follow up with your outpatient psychiatric provider and PCP.  # It was discussed with the patient, the impact of alcohol, drugs, tobacco have been there overall psychiatric and medical wellbeing, and total abstinence from substance use was recommended the patient.ed.  # Prescriptions provided or sent directly to preferred pharmacy at discharge. Patient agreeable to plan. Given opportunity to ask questions. Appears to feel comfortable with discharge.    # In the event of worsening  symptoms, the patient is instructed to call the crisis hotline, 911 and or go  to the nearest ED for appropriate evaluation and treatment of symptoms. To follow-up with primary care provider for other medical issues, concerns and or health care needs  # Patient was discharged to [facility] with a plan to follow up as noted below.    Total Time spent with patient: 20 minutes  Musculoskeletal: Strength & Muscle Tone: within normal limits Gait & Station: normal Patient leans: N/A  Psychiatric Specialty Exam  Presentation  General Appearance:  Appropriate for Environment; Casual  Eye Contact: Good  Speech: Clear and Coherent; Normal Rate  Speech Volume: Normal  Handedness: Right   Mood and Affect  Mood: Anxious  Duration of Depression Symptoms: No data recorded Affect: Appropriate   Thought Process  Thought Processes: Coherent; Goal Directed  Descriptions of Associations:Intact  Orientation:Full (Time, Place and Person)  Thought Content:WDL  History of Schizophrenia/Schizoaffective disorder:No data recorded Duration of Psychotic Symptoms:No data recorded Hallucinations:Hallucinations: Other (comment)  Ideas of Reference:None  Suicidal Thoughts:Suicidal Thoughts: No  Homicidal Thoughts:Homicidal Thoughts: No   Sensorium  Memory: Immediate Good; Recent Good  Judgment: Fair  Insight: Good   Executive Functions  Concentration: Good  Attention Span: Good  Recall: Good  Fund of Knowledge: Good  Language: Good   Psychomotor Activity  Psychomotor Activity: Psychomotor Activity: Normal   Assets  Assets: Communication Skills; Desire for Improvement; Vocational/Educational; Social Support; Physical Health   Sleep  Sleep: Sleep: Good   Physical Exam: Physical Exam ROS Blood pressure 133/74, pulse 96, temperature 97.8 F (36.6 C), temperature source Oral, resp. rate 16, height 5\' 8"  (1.727 m), weight 52.7 kg, SpO2 100 %. Body mass  index is 17.67 kg/m.  Mental Status Per Nursing Assessment::   On Admission:  Suicidal ideation indicated by patient  Nursing information obtained from:  Patient Demographic factors:  Adolescent or young adult, Gay, lesbian, or bisexual orientation, Low socioeconomic status Loss Factors:  Financial problems / change in socioeconomic status Historical Factors:  Domestic violence Risk Reduction Factors:  NA   Follow-up Information     [place] [phone]. Schedule an appointment as soon as possible for a visit.   Specialty: Behavioral Health Why: Please call or go to this provider to schedule an assessment, to obtain therapy and medication management services; on a Monday or a Wednesday morning, arrive by 7:30 am.  Services are provided on a first come, first served basis.  Once you have scheduled and completed your assessment, appointments will be scheduled for therapy and medication management. Contact information: [address] [place] [phone]        Services, [facility] Follow up on 08/31/2022.   Why: Referral made. Patient has been accepted to inpatient treatment and will report for intake at 900am Contact information: [address] [place] [phone]                 Plan Of Care/Follow-up recommendations:  Activity: As tolerated  Diet: Heart healthy  Other:  -Follow-up with your outpatient psychiatric provider -instructions on appointment date, time, and address (location) are provided to you in discharge paperwork.   -Take your psychiatric medications as prescribed at discharge - instructions are provided to you in the discharge paperwork. You are given sample medication plus paper Rx for [facility].    -Follow-up with outpatient primary care doctor and other specialists -for management of preventative medicine and chronic medical disease, including: hypertension   -Testing: Follow-up  with outpatient provider for abnormal lab results:  none   -  Recommend abstinence from alcohol, tobacco, and other illicit drug use at discharge.    -If your psychiatric symptoms recur, worsen, or if you have side effects to your psychiatric medications, call your outpatient psychiatric provider, 911, [phone] or go to the nearest emergency department.   -If suicidal thoughts recur, call your outpatient psychiatric provider, 911, [phone] or go to the nearest emergency department.  Malachy Mood, PA 08/30/2022, 3:35 PM

## 2022-08-30 NOTE — BHH Group Notes (Signed)
  Spiritual care group on grief and loss facilitated by chaplain Janne Napoleon, [facility]   Group Goal:   Support / Education around grief and loss   Members engage in facilitated group support and psycho-social education.   Group Description:   Following introductions and group rules, group members engaged in facilitated group dialog and support around topic of loss, with particular support around experiences of loss in their lives. Group Identified types of loss (relationships / self / things) and identified patterns, circumstances, and changes that precipitate losses. Reflected on thoughts / feelings around loss, normalized grief responses, and recognized variety in grief experience. Group noted Worden's four tasks of grief in discussion.   Group drew on Adlerian / Rogerian, narrative, MI,   Patient Progress: [place] attended group and actively engaged and participated in the group conversation.  She lost her father when she was young and has processed her loss at different times through her life.  She demonstrated good insight into the topic and was supportive of peers.  [address], [place] Pager, [phone]

## 2022-08-30 NOTE — Progress Notes (Signed)
[facility] MD Progress Note  08/30/2022 12:36 PM HANYA Hull  MRN:  [id number] Subjective:    Lori Hull is a 21 year old female with a past psychiatric history significant for bipolar disorder, generalized anxiety disorder, social anxiety disorder, panic disorder, and ADHD was admitted to [facility] from [facility] ED due to suicidal ideations and polysubstance abuse.   Patient was seen today by Hilaria Ota for reevaluation. Patient is currently being managed on the following psychiatric medications:   Seroquel 100 mg at bedtime Prozac 40 mg daily Gabapentin 300 mg 3 times daily Lamictal at 25 mg daily  Patient reports that they are doing well.  Patient states that she has been notified that she will be going to [facility]; however, patient thought she would be discharged from the facility today.  She states that she had already planned with her mother to be picked up today due to needing to pick up the rest of her belongings up from [facility]. Patient has expressed having some cravings for methamphetamine to staff.  She denies depression but still endorses some anxiety and rates her anxiety at 5 out of 10.  Patient denies suicidal or homicidal ideations.  She further denies auditory or visual hallucinations and does not appear to be responding to internal/external stimuli.  Patient denies paranoia but states that she feels safe.  Patient denies delusional thoughts.  She expresses no pain today.  Patient endorses good appetite as well as good sleep.  Patient denies being a danger to herself.  Collateral from patient's mother Adonis Huguenin [place], [phone]): provider was able to reach out to patient's mother regarding discharge plan. Patient's mother expressed concerns on whether or not she was responsible for picking up the patient and on what day. Patient's mother states that she had originally talked with the patient and discussed being picked up today. Patient's  mother informed the provider that she was somewhat nervous about picking up the patient due being afraid that she would not be able to control the patient and the patient would run off and not go to [facility]. Provider informed patient's mother that the plan was for the patient to be discharged from the facility tomorrow morning. Patient's mother inquired if she was responsible for picking up patient in morning. Provider informed patient's mother that social work would be informed of her concerns regarding patient's transfer to [facility]. Social work has reached out to patient's mother regarding her concerns.   Principal Problem: Bipolar disorder, current episode depressed, moderate ([place]) Diagnosis: Principal Problem:   Bipolar disorder, current episode depressed, moderate (HCC) Active Problems:   GAD (generalized anxiety disorder)   Social anxiety disorder   Polysubstance abuse ([place])  Total Time spent with patient: 15 minutes  Past Psychiatric History:  Patient reported that she has a past psychiatric history significant for bipolar 2 disorder, depression, panic disorder, social anxiety, and ADHD.  Patient is currently being seen by Dr. Margaretha Seeds at [facility].  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[place])    Seizures ([place])     Past Surgical History:  Procedure Laterality Date   ENDARTERECTOMY FEMORAL Left 10/01/2019   Procedure: Left Femoral Endarterectomy with Patch Angioplasty;  Surgeon: Elam Dutch, MD;  Location: [facility] OR;  Service: Vascular;  Laterality: Left;   THORACIC AORTIC ENDOVASCULAR STENT  GRAFT N/A 10/01/2019   Procedure: THORACIC AORTIC ENDOVASCULAR STENT GRAFT;  Surgeon: Sherren Kerns, MD;  Location: [facility] OR;  Service: Vascular;  Laterality: N/A;   Family History:  Family History  Problem Relation Age of Onset   Anxiety disorder Mother    Cancer  Mother        breast   Anxiety disorder Brother    Drug abuse Brother    Alcohol abuse Paternal Grandfather    Anxiety disorder Cousin    Heart attack Father    Family Psychiatric  History:  Patient states that her family does not identify with psychiatric illness symptoms  Social History:  Social History   Substance and Sexual Activity  Alcohol Use Not Currently     Social History   Substance and Sexual Activity  Drug Use Not Currently   Types: Marijuana, Heroin, Other-see comments   Comment: Heroin: Last PTA, daily basis     Social History   Socioeconomic History   Marital status: Single    Spouse name: Not on file   Number of children: Not on file   Years of education: Not on file   Highest education level: Not on file  Occupational History   Not on file  Tobacco Use   Smoking status: Every Day    Types: E-cigarettes   Smokeless tobacco: Never   Tobacco comments:    Smoking Jewel pods  Vaping Use   Vaping Use: Former  Substance and Sexual Activity   Alcohol use: Not Currently   Drug use: Not Currently    Types: Marijuana, Heroin, Other-see comments    Comment: Heroin: Last PTA, daily basis    Sexual activity: Yes    Birth control/protection: Implant  Other Topics Concern   Not on file  Social History Narrative   Not on file   Social Determinants of Health   Financial Resource Strain: Not on file  Food Insecurity: Food Insecurity Present (08/25/2022)   Hunger Vital Sign    Worried About Running Out of Food in the Last Year: Often true    Ran Out of Food in the Last Year: Often true  Transportation Needs: No Transportation Needs (08/25/2022)   PRAPARE - Administrator, Civil Service (Medical): No    Lack of Transportation (Non-Medical): No  Physical Activity: Not on file  Stress: Stress Concern Present (02/05/2019)   [employer] of Occupational Health - Occupational Stress Questionnaire    Feeling of Stress : Very much  Social  Connections: Not on file   Additional Social History:     Sleep: Good  Appetite:  Good  Current Medications: Current Facility-Administered Medications  Medication Dose Route Frequency Provider Last Rate Last Admin   acetaminophen (TYLENOL) tablet 650 mg  650 mg Oral Q6H PRN Ophelia Shoulder E, NP   650 mg at 08/27/22 0826   alum & mag hydroxide-simeth (MAALOX/MYLANTA) 200-200-20 MG/5ML suspension 30 mL  30 mL Oral Q4H PRN Chales Abrahams, NP       FLUoxetine (PROZAC) capsule 40 mg  40 mg Oral Daily Ophelia Shoulder E, NP   40 mg at 08/30/22 0931   gabapentin (NEURONTIN) capsule 300 mg  300 mg Oral TID Chales Abrahams, NP   300 mg at 08/30/22 0932   hydrOXYzine (ATARAX) tablet 25 mg  25 mg Oral TID PRN Ajibola, Ene A, NP       lamoTRIgine (LAMICTAL) tablet 50 mg  50 mg Oral QHS Leevy-Johnson, Lina Sar, NP  50 mg at 08/29/22 2110   magnesium hydroxide (MILK OF MAGNESIA) suspension 30 mL  30 mL Oral Daily PRN Ophelia Shoulder E, NP       nicotine (NICODERM CQ - dosed in mg/24 hours) patch 14 mg  14 mg Transdermal Daily Massengill, Harrold Donath, MD   14 mg at 08/30/22 0931   norethindrone-ethinyl estradiol-iron (ESTROSTEP FE) 1-20/1-30/1-35 MG-MCG tablet 1 tablet  1 tablet Oral Daily Ophelia Shoulder E, NP       QUEtiapine (SEROQUEL) tablet 100 mg  100 mg Oral QHS Massengill, Harrold Donath, MD   100 mg at 08/29/22 2109   traZODone (DESYREL) tablet 50 mg  50 mg Oral QHS PRN Chales Abrahams, NP        Lab Results: No results found for this or any previous visit (from the past 48 hour(s)).  Blood Alcohol level:  Lab Results  Component Value Date   ETH <10 08/25/2022   ETH <10 01/02/2018    Metabolic Disorder Labs: Lab Results  Component Value Date   HGBA1C 5.3 07/19/2022   No results found for: "PROLACTIN" Lab Results  Component Value Date   CHOL 178 07/19/2022   TRIG 63.0 07/19/2022   HDL 73.40 07/19/2022   CHOLHDL 2 07/19/2022   VLDL 12.6 07/19/2022   LDLCALC 92 07/19/2022    Physical  Findings: AIMS:  , ,  ,  ,    CIWA:    COWS:     Musculoskeletal: Strength & Muscle Tone: within normal limits Gait & Station: normal Patient leans: N/A  Psychiatric Specialty Exam:  Presentation  General Appearance:  Appropriate for Environment; Casual  Eye Contact: Good  Speech: Clear and Coherent; Normal Rate  Speech Volume: Normal  Handedness: Right   Mood and Affect  Mood: Anxious  Affect: Appropriate   Thought Process  Thought Processes: Coherent; Goal Directed  Descriptions of Associations:Intact  Orientation:Full (Time, Place and Person)  Thought Content:WDL  History of Schizophrenia/Schizoaffective disorder:No data recorded Duration of Psychotic Symptoms:No data recorded Hallucinations:Hallucinations: Other (comment)  Ideas of Reference:None  Suicidal Thoughts:Suicidal Thoughts: No  Homicidal Thoughts:Homicidal Thoughts: No   Sensorium  Memory: Immediate Good; Recent Good  Judgment: Fair  Insight: Good   Executive Functions  Concentration: Good  Attention Span: Good  Recall: Good  Fund of Knowledge: Good  Language: Good   Psychomotor Activity  Psychomotor Activity: Psychomotor Activity: Normal   Assets  Assets: Communication Skills; Desire for Improvement; Vocational/Educational; Social Support; Physical Health   Sleep  Sleep: Sleep: Good    Physical Exam: Physical Exam Psychiatric:        Attention and Perception: Attention and perception normal. She does not perceive auditory or visual hallucinations.        Mood and Affect: Affect normal. Mood is anxious. Mood is not depressed.        Speech: Speech normal.        Behavior: Behavior normal. Behavior is cooperative.        Thought Content: Thought content normal. Thought content is not paranoid or delusional. Thought content does not include homicidal or suicidal ideation.        Cognition and Memory: Cognition and memory normal.        Judgment:  Judgment normal.    Review of Systems  Psychiatric/Behavioral:  Positive for substance abuse. Negative for depression, hallucinations and suicidal ideas. The patient is not nervous/anxious and does not have insomnia.    Blood pressure 133/74, pulse 96, temperature 97.8 F (36.6 C), temperature source  Oral, resp. rate 16, height 5\' 8"  (1.727 m), weight 52.7 kg, SpO2 100 %. Body mass index is 17.67 kg/m.   Treatment Plan Summary:  Daily contact with patient to assess and evaluate symptoms and progress in treatment and Medication management  Safety and Monitoring: Voluntary admission to inpatient psychiatric unit for safety, stabilization and treatment Daily contact with patient to assess and evaluate symptoms and progress in treatment Patient's case to be discussed in multi-disciplinary team meeting Observation Level : q15 minute checks Vital signs: q12 hours Precautions: safety   Diagnoses:  Principal Problem:   Bipolar disorder, current episode depressed, moderate (HCC) Active Problems:   GAD (generalized anxiety disorder)   Social anxiety disorder   Polysubstance abuse (HCC)   Bipolar disorder, current episode depressed, moderate -Continue Seroquel 100 mg at bedtime -Continue Prozac 40 mg daily -Lamictal to be titrated up to 100 mg for tonight.   Generalized anxiety disorder Social anxiety disorder -Continue Gabapentin 300 mg 3 times daily -Continue Hydroxyzine 25 mg every 6 hours PRN   Polysubstance abuse -UDS positive for cocaine, benzodiazepine, and amphetamine -Patient to be discharged to [facility] residential treatment program.   Other PRNS -Continue Tylenol 650 mg every 6 hours PRN for mild pain -Continue Maalox 30 mg every 4 hrs PRN for indigestion -Continue Milk of Magnesia as needed every 6 hrs for constipation -Continue trazodone 50 mg at bedtime as needed for insomnia    Discharge Planning: Social work and case management to assist with discharge planning  and identification of hospital follow-up needs prior to discharge Discharge Concerns: Need to establish a safety plan; Medication compliance and effectiveness Discharge Goals: Return home with outpatient referrals for mental health follow-up including medication management/psychotherapy   I certify that inpatient services furnished can reasonably be expected to improve the patient's condition.   [facility], PA 08/30/2022, 12:36 PM

## 2022-08-30 NOTE — Progress Notes (Signed)
Adult Psychoeducational Group Note  Date:  08/30/2022 Time:  9:29 PM  Group Topic/Focus:  AA group  Participation Level:  Active  Participation Quality:  Appropriate, Attentive, and Sharing  Affect:  Appropriate  Cognitive:  Alert and Appropriate  Insight: Appropriate  Engagement in Group:  Engaged  Modes of Intervention:  Discussion and Support  Additional Comments:   Pt attended and actively participated in the AA Group.  Lori Hull Lori Hull 08/30/2022, 9:29 PM

## 2022-08-30 NOTE — Group Note (Signed)
LCSW Group Therapy Note   Group Date: 08/30/2022 Start Time: 1300 End Time: 1400  Type of Therapy and Topic: Group Therapy: Anger Management   Participation Level:  Active  Description of Group: In this group, patients will learn helpful strategies and techniques to manage anger, express anger in alternative ways, change hostile attitudes, and prevent aggressive acts, such as verbal abuse and violence. This group will be process-oriented and educational, with patients participating in exploration of their own experiences as well as giving and receiving support and challenge from other group members.  Therapeutic Goals: Patient will learn to manage anger. Patient will learn to stop violence or the threat of violence. Patient will learn to develop self-control over thoughts and actions. Patient will receive support and feedback from others  Summary of Patient Progress:  The Pt attended group and remained there the entire time.  The Pt participated and shared experiences where they became angry about something and was able to change their thoughts and mood prior to it escalating.  The Pt was able to determine coping skills that would be beneficial in the future and shared their thoughts and ideas openly with their peers.    Therapeutic Modalities: Cognitive Behavioral Therapy Solution Focused Therapy Motivational Interviewing  Darleen Crocker, [place] 08/30/2022  1:50 PM

## 2022-08-30 NOTE — Discharge Summary (Signed)
Physician Discharge Summary Note  Patient:  Lori Hull is an 21 y.o., female MRN:  [id number] DOB:  Jun 28, 2001 Patient phone:  [phone] (home)  Patient address:   [address] [place] [address],  Total Time spent with patient: {Time; 15 min - 8 hours:17441}  Date of Admission:  08/25/2022 Date of Discharge: 08/31/2022  Reason for Admission:  ***  History of Present Illness:  Lori Hull is a 21 year old female with a past psychiatric history significant for bipolar disorder, generalized anxiety disorder, social anxiety disorder, panic disorder, and ADHD was admitted to [facility] from [facility] ED due to suicidal ideations and polysubstance abuse.   Prior to presenting to [facility] ED, patient was previously living at [facility].  After relapsing on meth use, patient was kicked out of [facility].  Upon being kicked out of [place] [facility], patient was staying with a friend until she was kicked out due to a Technical sales engineer.  Patient states that she was living in her car prior to seeking out help.  Before presenting to the ED, patient presented as a walk-in at [facility] before deciding that she did not need help.   When asked the reason for presenting to the ED, patient states that she felt like killing herself but denies having a specific plan.  She states that she has been having thoughts of killing herself for the past 4 days.  In addition to suicidal thoughts, patient endorses depression that has been going on for a week.  Patient's depressive episodes are characterized by the following symptoms: crying spells, hopelessness, and lack of motivation.  Alleviating factors to her depressive episodes include socializing and getting out of bed to be productive.  Her depressive episodes are worsened by sleeping too much, over eating, not exercising enough, and lack of employment.  Patient endorses anxiety and rates her anxiety at 3 out of 10.  When around  people, patient rates her anxiety an 8 out of 10.  Patient endorses panic attacks and states that she has at least 1 panic attack per day, sometimes more.  Patient reports that she is taking the following medications:   Lamictal 100 mg daily Prozac 40 mg daily Seroquel 100 mg at bedtime Gabapentin 300 mg morning/night   Patient reports being stable on these medications and states that she last took medications a week ago.   Past Psychiatric Hx: Patient reported that she has a past psychiatric history significant for bipolar 2 disorder, depression, panic disorder, social anxiety, and ADHD.  Patient is currently being seen by Dr. Rickard Rhymes at [facility].   Substance use history:  Patient has a history of substance abuse states that she last used methamphetamine a week ago.  Patient states that she also has a history of heroin and Fentanyl use, last use in August.  Patient notes that she also has a history of alcohol abuse but states that her last drink occurred a couple of days ago.   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  Patient states that she occasionally loses consciousness when getting up too quickly from bed.  In regards to head trauma, patient states that whenever she used erratic, she will often hit her head.  Patient denies having her head looked at her physically hitting it.   Allergies: No known drug allergies LMP: Patient  unsure Contraception:  PCP: [facility]. Abbe Amsterdam at [facility] Mental Health Provider: Seen by Dr. Rickard Rhymes at [facility].. Therapist: No therapist at this time   Additional Social History: Patient was previously living at [place] [facility] prior to being admitted to this facility.  Patient was kicked out of [place] [facility] after relapsing on methamphetamine.  Principal Problem: Bipolar disorder, current episode depressed, moderate (HCC) Discharge Diagnoses: Principal Problem:   Bipolar disorder, current episode depressed, moderate (HCC) Active Problems:   GAD (generalized anxiety disorder)   Social anxiety disorder   Polysubstance abuse (HCC)   Past Psychiatric History: Patient reported that she has a past psychiatric history significant for bipolar 2 disorder, depression, panic disorder, social anxiety, and ADHD.  Patient is currently being seen by Dr. Rickard Rhymes at [facility]. Patient's psychiatric diagnosis upon discharge is bipolar disorder, most current episode depressed.  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HCC)    Seizures (HCC)     Past Surgical History:  Procedure Laterality Date   ENDARTERECTOMY FEMORAL Left 10/01/2019   Procedure: Left Femoral Endarterectomy with Patch Angioplasty;  Surgeon: Sherren Kerns, MD;  Location: [facility] OR;  Service: Vascular;  Laterality: Left;   THORACIC AORTIC ENDOVASCULAR STENT GRAFT N/A 10/01/2019   Procedure: THORACIC AORTIC ENDOVASCULAR STENT GRAFT;  Surgeon: Sherren Kerns, MD;  Location: [facility] OR;  Service: Vascular;  Laterality: N/A;   Family History:  Family History  Problem Relation Age of Onset   Anxiety disorder Mother    Cancer Mother        breast   Anxiety disorder Brother    Drug abuse Brother    Alcohol abuse Paternal Grandfather    Anxiety disorder Cousin    Heart attack Father    Family  Psychiatric  History: n/a Social History:  Social History   Substance and Sexual Activity  Alcohol Use Not Currently     Social History   Substance and Sexual Activity  Drug Use Not Currently   Types: Marijuana, Heroin, Other-see comments   Comment: Heroin: Last PTA, daily basis     Social History   Socioeconomic History   Marital status: Single    Spouse name: Not on file   Number of children: Not on file   Years of education: Not on file   Highest education level: Not on file  Occupational History   Not on file  Tobacco Use   Smoking status: Every Day    Types: E-cigarettes   Smokeless tobacco: Never   Tobacco comments:    Smoking Jewel pods  Vaping Use   Vaping Use: Former  Substance and Sexual Activity   Alcohol use: Not Currently   Drug use: Not Currently    Types: Marijuana, Heroin, Other-see comments    Comment: Heroin: Last PTA, daily basis    Sexual activity: Yes    Birth control/protection: Implant  Other Topics Concern  Not on file  Social History Narrative   Not on file   Social Determinants of Health   Financial Resource Strain: Not on file  Food Insecurity: Food Insecurity Present (08/25/2022)   Hunger Vital Sign    Worried About Running Out of Food in the Last Year: Often true    Ran Out of Food in the Last Year: Often true  Transportation Needs: No Transportation Needs (08/25/2022)   PRAPARE - Hydrologist (Medical): No    Lack of Transportation (Non-Medical): No  Physical Activity: Not on file  Stress: Stress Concern Present (02/05/2019)   [place]    Feeling of Stress : Very much  Social Connections: Not on file    HOSPITAL COURSE:  During the patient's hospitalization, patient had extensive initial psychiatric evaluation, and follow-up psychiatric evaluations every day.  Psychiatric diagnoses provided upon initial assessment: Bipolar  disorder, current episode depressed, moderate   Patient's psychiatric medications were adjusted on admission: Patient was started on Lamictal 25 mg. Lamictal was titrated fast due to patient taking 100 mg a week prior to admission and was stable on the medication. Patient Lamictal was titrated to 50 mg at bedtime 2 - 3 days later, followed by 100 mg bedtime 2 days later.   During the hospitalization, other adjustments were made to the patient's psychiatric medication regimen: No other adjustments made.  Patient's care was discussed during the interdisciplinary team meeting every day during the hospitalization.  The patient denies having side effects to prescribed psychiatric medication.  Gradually, patient started adjusting to milieu. The patient was evaluated each day by a clinical provider to ascertain response to treatment. Improvement was noted by the patient's report of decreasing symptoms, improved sleep and appetite, affect, medication tolerance, behavior, and participation in unit programming.  Patient was asked each day to complete a self inventory noting mood, mental status, pain, new symptoms, anxiety and concerns.    Symptoms were reported as significantly decreased or resolved completely by discharge.   On day of discharge, the patient reports that their mood is stable. The patient denied having suicidal thoughts for more than 48 hours prior to discharge.  Patient denies having homicidal thoughts.  Patient denies having auditory hallucinations.  Patient denies any visual hallucinations or other symptoms of psychosis. The patient was motivated to continue taking medication with a goal of continued improvement in mental health.   The patient reports their target psychiatric symptoms of bipolar depression responded well to the psychiatric medications, and the patient reports overall benefit other psychiatric hospitalization. Supportive psychotherapy was provided to the patient. The patient  also participated in regular group therapy while hospitalized. Coping skills, problem solving as well as relaxation therapies were also part of the unit programming.  Labs were reviewed with the patient, and abnormal results were discussed with the patient.  The patient is able to verbalize their individual safety plan to this provider.  # It is recommended to the patient to continue psychiatric medications as prescribed, after discharge from the hospital.    # It is recommended to the patient to follow up with your outpatient psychiatric provider and PCP.  # It was discussed with the patient, the impact of alcohol, drugs, tobacco have been there overall psychiatric and medical wellbeing, and total abstinence from substance use was recommended the patient.ed.  # Prescriptions provided or sent directly to preferred pharmacy at discharge. Patient agreeable to plan. Given opportunity to  ask questions. Appears to feel comfortable with discharge.    # In the event of worsening symptoms, the patient is instructed to call the crisis hotline, 911 and or go to the nearest ED for appropriate evaluation and treatment of symptoms. To follow-up with primary care provider for other medical issues, concerns and or health care needs  # [facility] with a plan to follow up as noted below.    Physical Findings: AIMS:  , ,  ,  ,    CIWA:    COWS:     Musculoskeletal: Strength & Muscle Tone: within normal limits Gait & Station: normal Patient leans: N/A   Psychiatric Specialty Exam:  Presentation  General Appearance:  Appropriate for Environment; Casual  Eye Contact: Good  Speech: Clear and Coherent; Normal Rate  Speech Volume: Normal  Handedness: Right   Mood and Affect  Mood: Anxious  Affect: Appropriate   Thought Process  Thought Processes: Coherent; Goal Directed  Descriptions of Associations:Intact  Orientation:Full (Time, Place and  Person)  Thought Content:WDL  History of Schizophrenia/Schizoaffective disorder:No data recorded Duration of Psychotic Symptoms:No data recorded Hallucinations:Hallucinations: Other (comment)  Ideas of Reference:None  Suicidal Thoughts:Suicidal Thoughts: No  Homicidal Thoughts:Homicidal Thoughts: No   Sensorium  Memory: Immediate Good; Recent Good  Judgment: Fair  Insight: Good   Executive Functions  Concentration: Good  Attention Span: Good  Recall: Good  Fund of Knowledge: Good  Language: Good   Psychomotor Activity  Psychomotor Activity: Psychomotor Activity: Normal   Assets  Assets: Communication Skills; Desire for Improvement; Vocational/Educational; Social Support; Physical Health   Sleep  Sleep: Sleep: Good    Physical Exam: Physical Exam ROS Blood pressure 133/74, pulse 96, temperature 97.8 F (36.6 C), temperature source Oral, resp. rate 16, height 5\' 8"  (1.727 m), weight 52.7 kg, SpO2 100 %. Body mass index is 17.67 kg/m.   Social History   Tobacco Use  Smoking Status Every Day   Types: E-cigarettes  Smokeless Tobacco Never  Tobacco Comments   Smoking Jewel pods   Tobacco Cessation:  N/A, patient does not currently use tobacco products   Blood Alcohol level:  Lab Results  Component Value Date   ETH <10 08/25/2022   ETH <10 01/02/2018    Metabolic Disorder Labs:  Lab Results  Component Value Date   HGBA1C 5.3 07/19/2022   No results found for: "PROLACTIN" Lab Results  Component Value Date   CHOL 178 07/19/2022   TRIG 63.0 07/19/2022   HDL 73.40 07/19/2022   CHOLHDL 2 07/19/2022   VLDL 12.6 07/19/2022   LDLCALC 92 07/19/2022    See Psychiatric Specialty Exam and Suicide Risk Assessment completed by Attending Physician prior to discharge.  Discharge destination:  [facility]  Is patient on multiple antipsychotic therapies at discharge:  No   Has Patient had three or more failed trials of  antipsychotic monotherapy by history:  No  Recommended Plan for Multiple Antipsychotic Therapies: NA  Discharge Instructions     Diet - low sodium heart healthy   Complete by: As directed    Increase activity slowly   Complete by: As directed       Allergies as of 08/30/2022   No Known Allergies      Medication List     STOP taking these medications    norethindrone-ethinyl estradiol-iron 1-20/1-30/1-35 MG-MCG tablet Commonly known as: ESTROSTEP FE       TAKE these medications      Indication  FLUoxetine 40  MG capsule Commonly known as: PROZAC Take 1 capsule (40 mg total) by mouth every morning.  Indication: Depressive Phase of Manic-Depression, Generalized Anxiety Disorder   gabapentin 300 MG capsule Commonly known as: NEURONTIN Take 1 capsule (300 mg total) by mouth 2 (two) times daily. Thirty days bridge supply given by Covering MD. Patient must established care with new provider.  Indication: Generalized Anxiety Disorder, Social Anxiety Disorder   lamoTRIgine 100 MG tablet Commonly known as: LAMICTAL Take 1 tablet (100 mg total) by mouth daily.  Indication: Manic-Depression   QUEtiapine 100 MG tablet Commonly known as: SEROQUEL Take 1 tablet (100 mg total) by mouth at bedtime. What changed:  when to take this reasons to take this  Indication: Depressive Phase of Manic-Depression, Manic Phase of Manic-Depression        Follow-up Information     [facility]. Schedule an appointment as soon as possible for a visit.   Specialty: Behavioral Health Why: Please call or go to this provider to schedule an assessment, to obtain therapy and medication management services; on a Monday or a Wednesday morning, arrive by 7:30 am.  Services are provided on a first come, first served basis.  Once you have scheduled and completed your assessment, appointments will be scheduled for therapy and medication management. Contact  information: [address] [place] [address] [phone]        Services, [facility] Follow up on 08/31/2022.   Why: Referral made. Patient has been accepted to inpatient treatment and will report for intake at 900am Contact information: [address] [place] [address] [phone]                 Follow-up recommendations:   Activity: As tolerated  Diet: Heart healthy  Other:  -Follow-up with your outpatient psychiatric provider -instructions on appointment date, time, and address (location) are provided to you in discharge paperwork.   -Take your psychiatric medications as prescribed at discharge - instructions are provided to you in the discharge paperwork. You are given sample medication plus paper Rx for [facility].    -Follow-up with outpatient primary care doctor and other specialists -for management of preventative medicine and chronic medical disease, including: hypertension   -Testing: Follow-up with outpatient provider for abnormal lab results:  none   -Recommend abstinence from alcohol, tobacco, and other illicit drug use at discharge.    -If your psychiatric symptoms recur, worsen, or if you have side effects to your psychiatric medications, call your outpatient psychiatric provider, 911, [phone] or go to the nearest emergency department.   -If suicidal thoughts recur, call your outpatient psychiatric provider, 911, 988 or go to the nearest emergency department.  Signed: Meta Hatchet, PA 08/30/2022, 4:12 PM

## 2022-08-30 NOTE — Progress Notes (Signed)
   08/30/22 0500  Sleep  Number of Hours 7

## 2022-08-30 NOTE — Plan of Care (Signed)
  Problem: Education: Goal: Understanding of discharge needs will improve Outcome: Progressing   Problem: Coping: Goal: Coping ability will improve Outcome: Progressing

## 2022-08-31 MED ORDER — LAMOTRIGINE 100 MG PO TABS: 100.0000 mg | ORAL_TABLET | Freq: Every day | ORAL | 0 refills | Status: DC

## 2022-08-31 MED ORDER — GABAPENTIN 300 MG PO CAPS: 300.0000 mg | ORAL_CAPSULE | Freq: Three times a day (TID) | ORAL | 0 refills | Status: AC

## 2022-08-31 MED ORDER — NICOTINE 14 MG/24HR TD PT24: 14.0000 mg | MEDICATED_PATCH | Freq: Every day | TRANSDERMAL | 0 refills | Status: AC

## 2022-08-31 NOTE — Progress Notes (Signed)
Pt discharged to lobby. Pt was stable and appreciative at that time. All papers, samples, suicide safety plan and prescriptions were given and valuables returned. Verbal understanding expressed. Denies SI/HI and A/VH. Pt given opportunity to express concerns and ask questions.  

## 2022-08-31 NOTE — Progress Notes (Signed)
   08/31/22 0548  Sleep  Number of Hours 6.75

## 2022-10-26 ENCOUNTER — Telehealth: Payer: Self-pay

## 2022-10-26 NOTE — Telephone Encounter (Signed)
Transition Care Management Unsuccessful Follow-up Telephone Call  Date of discharge and from where:  10/24/22 from [facility] for Tachycardia, unspecified  Attempts:  1st Attempt  Reason for unsuccessful TCM follow-up call:  Unable to reach patient; spoke with mother who will give patient message to return call.

## 2023-01-07 ENCOUNTER — Encounter: Payer: Self-pay | Admitting: Family Medicine

## 2023-02-25 ENCOUNTER — Encounter: Payer: Self-pay | Admitting: Family Medicine

## 2023-02-25 ENCOUNTER — Telehealth (INDEPENDENT_AMBULATORY_CARE_PROVIDER_SITE_OTHER): Payer: 59 | Admitting: Family Medicine

## 2023-02-25 VITALS — Ht 68.0 in | Wt 140.0 lb

## 2023-02-25 DIAGNOSIS — Z30011 Encounter for initial prescription of contraceptive pills: Secondary | ICD-10-CM | POA: Diagnosis not present

## 2023-02-25 DIAGNOSIS — L709 Acne, unspecified: Secondary | ICD-10-CM

## 2023-02-25 MED ORDER — NORETHINDRON-ETHINYL ESTRAD-FE 1-20/1-30/1-35 MG-MCG PO TABS: 1.0000 | ORAL_TABLET | Freq: Every day | ORAL | 11 refills | Status: AC

## 2023-02-25 NOTE — Progress Notes (Signed)
Virtual Visit via Video Note  I connected with Lori Hull on 02/25/23 at  2:00 PM EDT by a video enabled telemedicine application and verified that I am speaking with the correct person using two identifiers.  Location patient: home Location provider:work or home office Persons participating in the virtual visit: patient, provider  I discussed the limitations of evaluation and management by telemedicine and the availability of in person appointments. The patient expressed understanding and agreed to proceed. Chief Complaint  Patient presents with   Contraception     HPI: Patient is a 22 year old female with pmh sig for GAD, bipolar disorder, polysubstance abuse, overdose, anxiety, hep C who is seen for med refill/follow-up.  Pt wants to get back on OCPs to help with acne.  Using witch hazel, cervae, and a moisturizer. Thinks caffeine and sugar make acne worse.  LMP: 2 wks ago.  No migraines.  No history of DVT/PE.  Using e-cigarettes.  ROS: See pertinent positives and negatives per HPI.  Past Medical History:  Diagnosis Date   Allergy    Anxiety    Depression    Heart murmur    Hepatitis C    Protein deficiency    Seizures     Past Surgical History:  Procedure Laterality Date   ENDARTERECTOMY FEMORAL Left 10/01/2019   Procedure: Left Femoral Endarterectomy with Patch Angioplasty;  Surgeon: Sherren KernsFields, Charles E, MD;  [facility];  Service: Vascular;  Laterality: Left;   THORACIC AORTIC ENDOVASCULAR STENT GRAFT N/A 10/01/2019   Procedure: THORACIC AORTIC ENDOVASCULAR STENT GRAFT;  Surgeon: Sherren KernsFields, Charles E, MD;  [facility];  Service: Vascular;  Laterality: N/A;    Family History  Problem Relation Age of Onset   Anxiety disorder Mother    Cancer Mother        breast   Anxiety disorder Brother    Drug abuse Brother    Alcohol abuse Paternal Grandfather    Anxiety disorder Cousin    Heart attack Father       Current Outpatient Medications:    FLUoxetine  (PROZAC) 40 MG capsule, Take 1 capsule (40 mg total) by mouth every morning., Disp: 30 capsule, Rfl: 0   gabapentin (NEURONTIN) 300 MG capsule, Take 1 capsule (300 mg total) by mouth 3 (three) times daily., Disp: 90 capsule, Rfl: 0   QUEtiapine (SEROQUEL) 100 MG tablet, Take 1 tablet (100 mg total) by mouth at bedtime., Disp: 30 tablet, Rfl: 0  EXAM:  VITALS per patient if applicable:  RR between 12-20 bpm  GENERAL: alert, oriented, appears well and in no acute distress  HEENT: atraumatic, conjunctiva clear, no obvious abnormalities on inspection of external nose and ears  NECK: normal movements of the head and neck  LUNGS: on inspection no signs of respiratory distress, breathing rate appears normal, no obvious gross SOB, gasping or wheezing  CV: no obvious cyanosis  MS: moves all visible extremities without noticeable abnormality  PSYCH/NEURO: pleasant and cooperative, no obvious depression or anxiety, speech and thought processing grossly intact  ASSESSMENT AND PLAN:  Discussed the following assessment and plan:  Acne, unspecified acne type - Plan: norethindrone-ethinyl estradiol-iron (ESTROSTEP FE) 1-20/1-30/1-35 MG-MCG tablet  Encounter for initial prescription of contraceptive pills - Plan: norethindrone-ethinyl estradiol-iron (ESTROSTEP FE) 1-20/1-30/1-35 MG-MCG tablet  Follow-up as needed   I discussed the assessment and treatment plan with the patient. The patient was provided an opportunity to ask questions and all were answered. The patient agreed with the plan and demonstrated an understanding of  the instructions.   The patient was advised to call back or seek an in-person evaluation if the symptoms worsen or if the condition fails to improve as anticipated.   Deeann Saint, MD

## 2023-03-11 IMAGING — CT CT ANGIO CHEST
4 of 10 series · 17 of 46 positions shown · IV contrast (APPLIED)
Comparison: 12/14/2019

CLINICAL DATA: Aortic dissection, post repair, follow-up; shortness
of breath

EXAM:
CT ANGIOGRAPHY CHEST WITH CONTRAST
TECHNIQUE: Multidetector CT imaging of the chest was performed using the
standard protocol during bolus administration of intravenous
contrast. Multiplanar CT image reconstructions and MIPs were
obtained to evaluate the vascular anatomy.
CONTRAST:  100mL OMNIPAQUE IOHEXOL 350 MG/ML SOLN

[Series 4: axial pre · axial · non-contrast · 0.73mm/px · z∈[+1494,+1609]mm · 2 of 70 slices shown]
[im 24/70  lung]
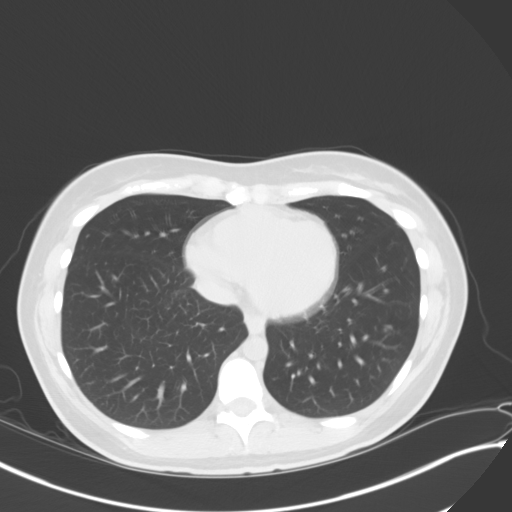
[im 47/70  soft-tissue]
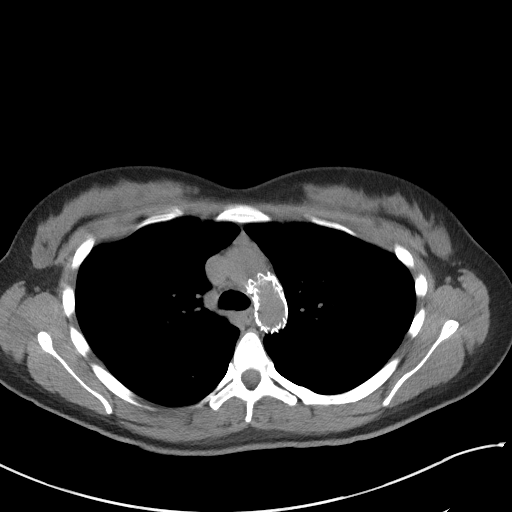

[Series 5: lung pre · axial · non-contrast · 0.73mm/px · z∈[+1414,+1688]mm · 8 of 173 slices shown]
[im 18/173  soft-tissue]
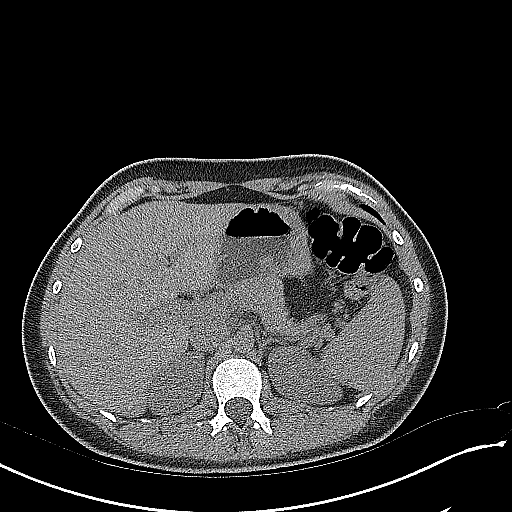
[im 35/173  soft-tissue]
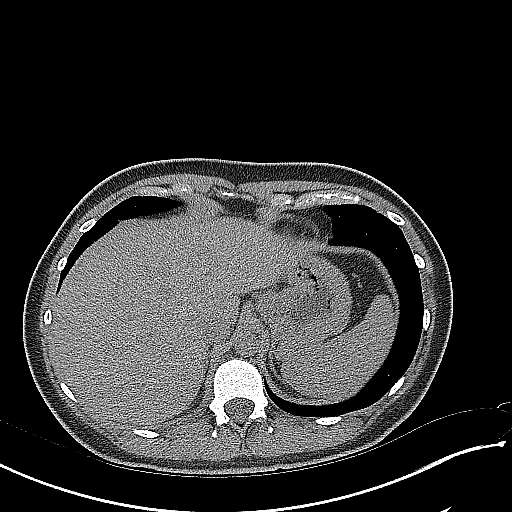
[im 52/173  soft-tissue]
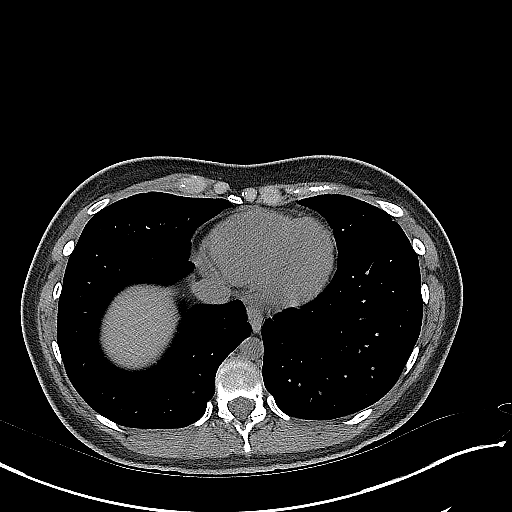
[im 69/173  soft-tissue]
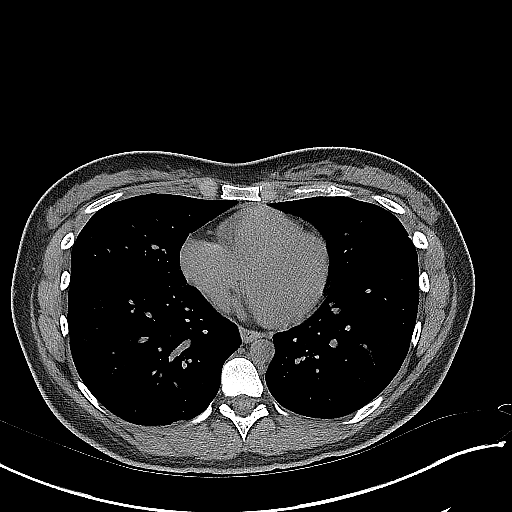
[im 104/173  soft-tissue]
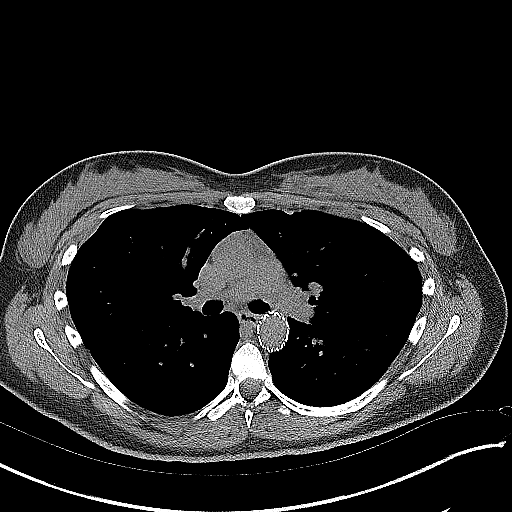
[im 121/173  soft-tissue]
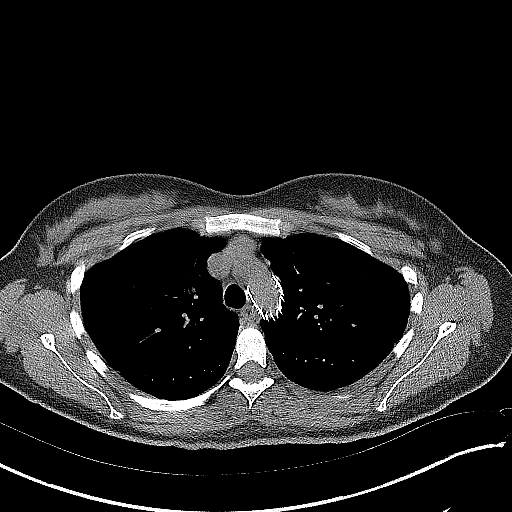
[im 138/173  soft-tissue]
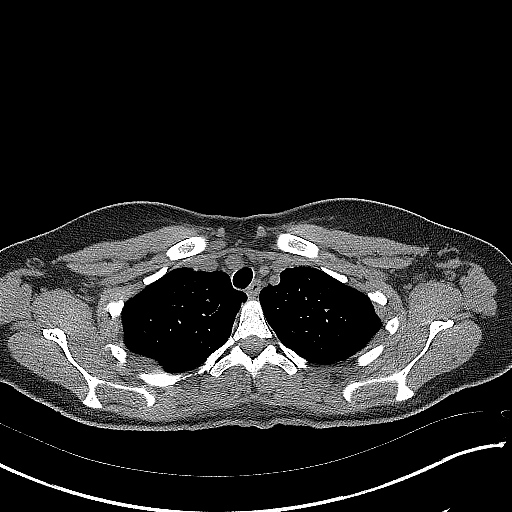
[im 155/173  soft-tissue]
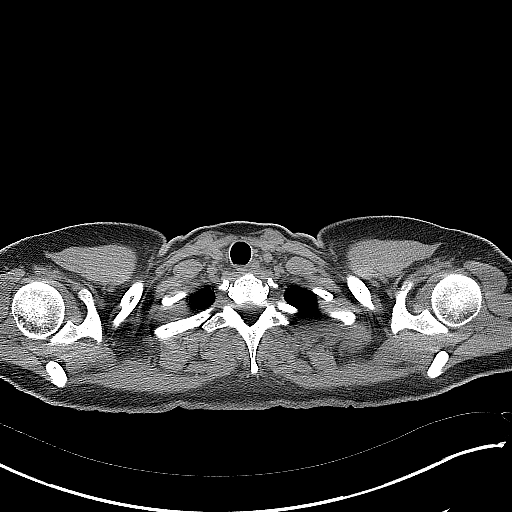

[Series 6: axial arterial · axial · arterial · 0.73mm/px · z∈[+1460,+1670]mm · 5 of 106 slices shown]
[im 18/106  lung]
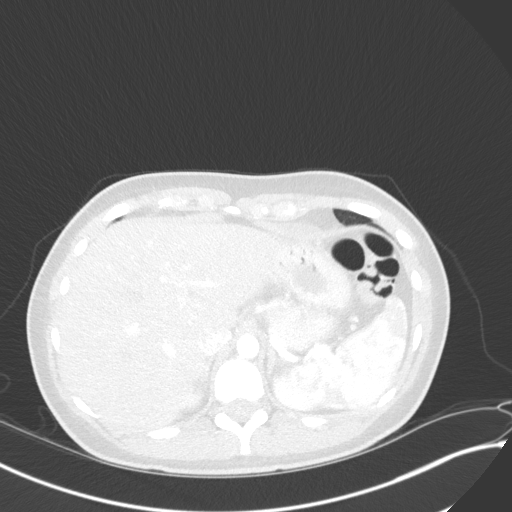
[im 36/106  lung]
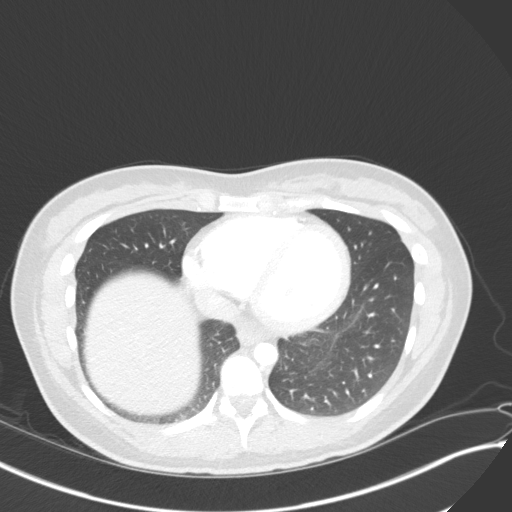
[im 53/106  lung]
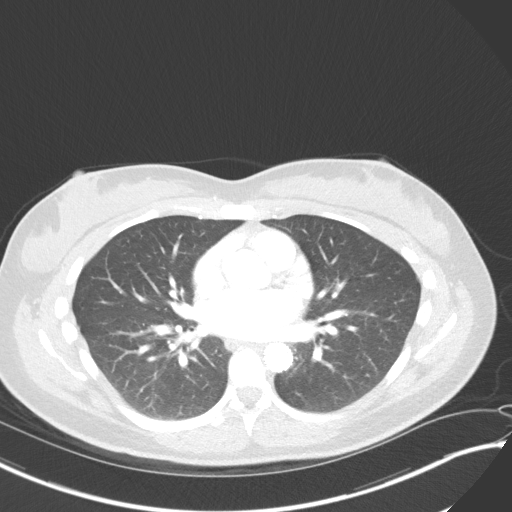
[im 71/106  lung]
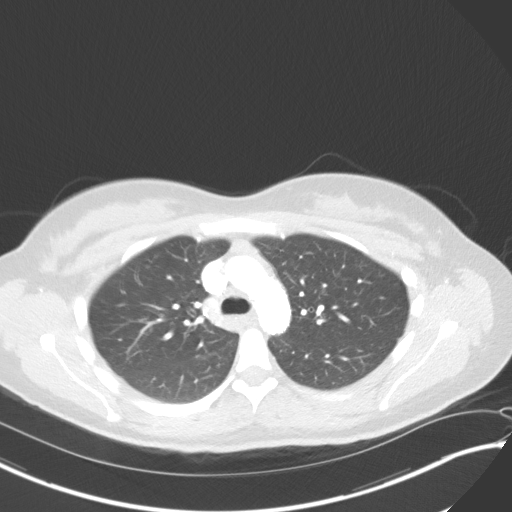
[im 88/106  lung]
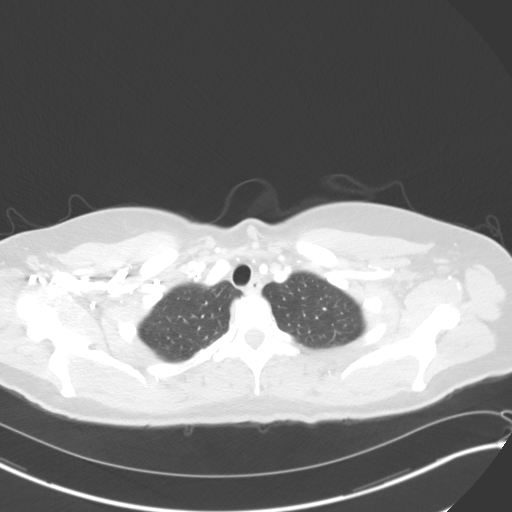

[Series 9: coronals · coronal · 0.63mm/px · 2 of 120 slices shown]
[im 40/120  soft-tissue]
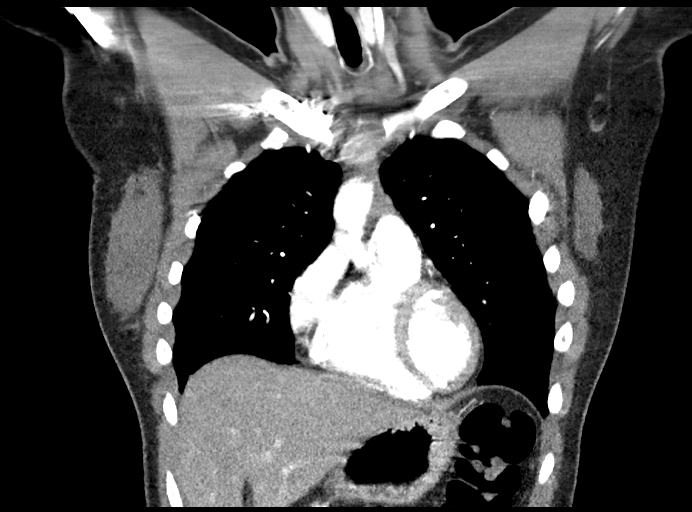
[im 80/120  soft-tissue]
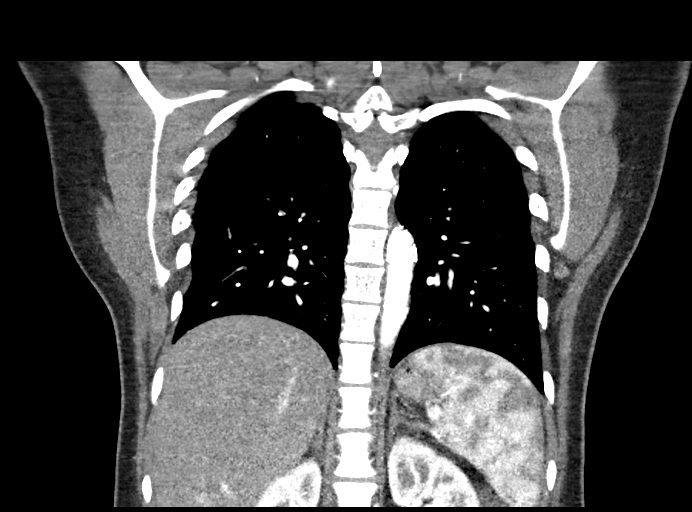

[17 of 46 positions shown; findings below may reference images not displayed]

FINDINGS: Cardiovascular: Heart size normal. No pericardial effusion.
Satisfactory opacification of pulmonary arteries noted, and there is
no evidence of pulmonary emboli. Good contrast opacification of the
thoracic aorta. No evidence of dissection, aneurysm, or stenosis.
Classic 3 vessel brachiocephalic arterial origin anatomy. Stable
stent graft extending from just beyond the left subclavian artery
origin into the mid descending thoracic aorta at the level of the
inferior right pulmonary vein. Stent graft appears well apposed.
There is no aneurysm or false lumen.

Mediastinum/Nodes: No mediastinal hematoma, mass or adenopathy.

Lungs/Pleura: No pleural effusion. No pneumothorax. Lungs are clear.

Upper Abdomen: No acute abnormality.

Musculoskeletal: No chest wall abnormality. No acute or significant
osseous findings.

Review of the MIP images confirms the above findings.
IMPRESSION: 1. Stable thoracic stent graft repair of traumatic aortic
pseudoaneurysm.

## 2023-09-06 ENCOUNTER — Other Ambulatory Visit: Payer: Self-pay | Admitting: Obstetrics and Gynecology

## 2023-09-06 DIAGNOSIS — N63 Unspecified lump in unspecified breast: Secondary | ICD-10-CM

## 2023-09-19 ENCOUNTER — Other Ambulatory Visit: Payer: 59

## 2024-04-25 ENCOUNTER — Other Ambulatory Visit: Payer: Self-pay | Admitting: Family Medicine

## 2024-04-25 DIAGNOSIS — Z30011 Encounter for initial prescription of contraceptive pills: Secondary | ICD-10-CM

## 2024-04-25 DIAGNOSIS — L709 Acne, unspecified: Secondary | ICD-10-CM

## 2024-07-18 ENCOUNTER — Ambulatory Visit: Payer: Self-pay

## 2024-07-18 NOTE — Telephone Encounter (Signed)
 Noted

## 2024-07-18 NOTE — Telephone Encounter (Signed)
 FYI Only or Action Required?: FYI only for provider.  Patient was last seen in primary care on 02/25/2023 by Mercer Clotilda SAUNDERS, MD.  Called Nurse Triage reporting No chief complaint on file..  Symptoms began yesterday.  Interventions attempted: Nothing.  Symptoms are: unchanged.  Triage Disposition: See PCP When Office is Open (Within 3 Days)  Patient/caregiver understands and will follow disposition?: Yes   Reason for Disposition  Patient is worried they have a sexually transmitted infection (STI)  Answer Assessment - Initial Assessment Questions 1. SYMPTOM: What's the main symptom you're concerned about? (e.g., rash, itching, swelling, dryness)     lesion 2. LOCATION: Where is the  bump located? (e.g., inside/outside, left/right)     Right side  3. ONSET: When did the  bump  start?     Last night 4. PAIN: Is there any pain? If Yes, ask: How bad is it? (Scale: 0-10; none, mild, moderate, severe)     A little bit of pain 5. CAUSE: What do you think is causing the symptoms?     herpes 6. OTHER SYMPTOMS: Do you have any other symptoms? (e.g., fever, vaginal bleeding, pain with urination)     no 7. PREGNANCY: Is there any chance you are pregnant? When was your last menstrual period?     no  Protocols used: Vulvar Symptoms-A-AH

## 2024-07-20 ENCOUNTER — Encounter: Payer: Self-pay | Admitting: Family Medicine

## 2024-07-20 ENCOUNTER — Ambulatory Visit (INDEPENDENT_AMBULATORY_CARE_PROVIDER_SITE_OTHER): Payer: Self-pay | Admitting: Family Medicine

## 2024-07-20 VITALS — BP 110/70 | HR 117 | Temp 98.2°F | Ht 68.0 in | Wt 140.2 lb

## 2024-07-20 DIAGNOSIS — L720 Epidermal cyst: Secondary | ICD-10-CM

## 2024-07-20 DIAGNOSIS — Z113 Encounter for screening for infections with a predominantly sexual mode of transmission: Secondary | ICD-10-CM
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
## 2024-07-21 LAB — HSV(HERPES SIMPLEX VRS) I + II AB-IGG
HSV 1 IGG,TYPE SPECIFIC AB: 22.9 {index} — ABNORMAL HIGH
HSV 2 IGG,TYPE SPECIFIC AB: <0.9 {index}

## 2024-07-25 ENCOUNTER — Encounter: Payer: Self-pay | Admitting: Family Medicine

## 2024-07-25 ENCOUNTER — Ambulatory Visit: Payer: Self-pay | Admitting: Family Medicine

## 2024-07-25 DIAGNOSIS — B009 Herpesviral infection, unspecified: Secondary | ICD-10-CM | POA: Insufficient documentation

## 2024-07-26 ENCOUNTER — Ambulatory Visit: Payer: Self-pay | Admitting: Family Medicine

## 2024-10-01 ENCOUNTER — Encounter: Admitting: Family Medicine
